# Patient Record
Sex: Female | Born: 1975 | Race: White | Hispanic: No | Marital: Married | Smoking: Former smoker
Health system: Southern US, Community
[De-identification: ages and names within clinical notes are randomized; demographics above are authoritative.]

## PROBLEM LIST (undated history)

## (undated) DIAGNOSIS — R000 Tachycardia, unspecified: Secondary | ICD-10-CM

## (undated) DIAGNOSIS — O24419 Gestational diabetes mellitus in pregnancy, unspecified control: Secondary | ICD-10-CM

## (undated) DIAGNOSIS — Z9889 Other specified postprocedural states: Secondary | ICD-10-CM

## (undated) DIAGNOSIS — IMO0002 Reserved for concepts with insufficient information to code with codable children: Secondary | ICD-10-CM

## (undated) DIAGNOSIS — R112 Nausea with vomiting, unspecified: Secondary | ICD-10-CM

## (undated) HISTORY — PX: LEEP: SHX91

---

## 1998-01-23 ENCOUNTER — Other Ambulatory Visit: Admission: RE | Admit: 1998-01-23 | Discharge: 1998-01-23 | Payer: Self-pay | Admitting: Obstetrics and Gynecology

## 1998-06-15 ENCOUNTER — Inpatient Hospital Stay (HOSPITAL_COMMUNITY): Admission: AD | Admit: 1998-06-15 | Discharge: 1998-06-15 | Payer: Self-pay | Admitting: Obstetrics and Gynecology

## 1998-08-08 ENCOUNTER — Inpatient Hospital Stay (HOSPITAL_COMMUNITY): Admission: AD | Admit: 1998-08-08 | Discharge: 1998-08-10 | Payer: Self-pay | Admitting: *Deleted

## 1998-12-04 ENCOUNTER — Other Ambulatory Visit: Admission: RE | Admit: 1998-12-04 | Discharge: 1998-12-04 | Payer: Self-pay | Admitting: *Deleted

## 1998-12-26 ENCOUNTER — Ambulatory Visit (HOSPITAL_COMMUNITY): Admission: RE | Admit: 1998-12-26 | Discharge: 1998-12-26 | Payer: Self-pay | Admitting: *Deleted

## 2001-01-27 ENCOUNTER — Ambulatory Visit (HOSPITAL_COMMUNITY): Admission: RE | Admit: 2001-01-27 | Discharge: 2001-01-27 | Payer: Self-pay | Admitting: Family Medicine

## 2001-01-27 ENCOUNTER — Encounter: Payer: Self-pay | Admitting: Family Medicine

## 2001-08-03 HISTORY — PX: CHOLECYSTECTOMY: SHX55

## 2002-03-03 ENCOUNTER — Other Ambulatory Visit: Admission: RE | Admit: 2002-03-03 | Discharge: 2002-03-03 | Payer: Self-pay | Admitting: Obstetrics and Gynecology

## 2002-08-03 HISTORY — PX: WRIST SURGERY: SHX841

## 2002-08-24 ENCOUNTER — Ambulatory Visit (HOSPITAL_COMMUNITY): Admission: RE | Admit: 2002-08-24 | Discharge: 2002-08-24 | Payer: Self-pay | Admitting: Family Medicine

## 2002-08-24 ENCOUNTER — Encounter: Payer: Self-pay | Admitting: Family Medicine

## 2002-12-04 ENCOUNTER — Ambulatory Visit (HOSPITAL_COMMUNITY): Admission: RE | Admit: 2002-12-04 | Discharge: 2002-12-04 | Payer: Self-pay | Admitting: *Deleted

## 2002-12-04 ENCOUNTER — Encounter: Payer: Self-pay | Admitting: *Deleted

## 2003-01-31 ENCOUNTER — Encounter (INDEPENDENT_AMBULATORY_CARE_PROVIDER_SITE_OTHER): Payer: Self-pay | Admitting: Specialist

## 2003-01-31 ENCOUNTER — Observation Stay (HOSPITAL_COMMUNITY): Admission: RE | Admit: 2003-01-31 | Discharge: 2003-02-01 | Payer: Self-pay | Admitting: *Deleted

## 2004-09-16 ENCOUNTER — Other Ambulatory Visit: Admission: RE | Admit: 2004-09-16 | Discharge: 2004-09-16 | Payer: Self-pay | Admitting: Obstetrics and Gynecology

## 2004-10-30 ENCOUNTER — Emergency Department (HOSPITAL_COMMUNITY): Admission: EM | Admit: 2004-10-30 | Discharge: 2004-10-31 | Payer: Self-pay | Admitting: Emergency Medicine

## 2007-08-04 DIAGNOSIS — O24419 Gestational diabetes mellitus in pregnancy, unspecified control: Secondary | ICD-10-CM

## 2007-08-04 HISTORY — DX: Gestational diabetes mellitus in pregnancy, unspecified control: O24.419

## 2008-05-06 ENCOUNTER — Inpatient Hospital Stay (HOSPITAL_COMMUNITY): Admission: AD | Admit: 2008-05-06 | Discharge: 2008-05-06 | Payer: Self-pay | Admitting: Obstetrics and Gynecology

## 2008-05-14 ENCOUNTER — Inpatient Hospital Stay (HOSPITAL_COMMUNITY): Admission: AD | Admit: 2008-05-14 | Discharge: 2008-05-16 | Payer: Self-pay | Admitting: Obstetrics and Gynecology

## 2008-05-25 ENCOUNTER — Encounter: Admission: RE | Admit: 2008-05-25 | Discharge: 2008-06-22 | Payer: Self-pay | Admitting: Obstetrics and Gynecology

## 2008-06-27 ENCOUNTER — Encounter: Admission: RE | Admit: 2008-06-27 | Discharge: 2008-06-27 | Payer: Self-pay | Admitting: Obstetrics and Gynecology

## 2009-08-03 DIAGNOSIS — IMO0002 Reserved for concepts with insufficient information to code with codable children: Secondary | ICD-10-CM

## 2009-08-03 DIAGNOSIS — R87619 Unspecified abnormal cytological findings in specimens from cervix uteri: Secondary | ICD-10-CM

## 2009-08-03 HISTORY — DX: Unspecified abnormal cytological findings in specimens from cervix uteri: R87.619

## 2009-08-03 HISTORY — DX: Reserved for concepts with insufficient information to code with codable children: IMO0002

## 2010-08-24 ENCOUNTER — Encounter: Payer: Self-pay | Admitting: Obstetrics and Gynecology

## 2010-09-29 ENCOUNTER — Other Ambulatory Visit: Payer: Self-pay | Admitting: Certified Nurse Midwife

## 2010-10-03 ENCOUNTER — Ambulatory Visit (INDEPENDENT_AMBULATORY_CARE_PROVIDER_SITE_OTHER): Payer: 59

## 2010-10-03 ENCOUNTER — Inpatient Hospital Stay (INDEPENDENT_AMBULATORY_CARE_PROVIDER_SITE_OTHER)
Admission: RE | Admit: 2010-10-03 | Discharge: 2010-10-03 | Disposition: A | Discharge status: Home / Self Care | Payer: 59 | Source: Ambulatory Visit | Attending: Emergency Medicine | Admitting: Emergency Medicine

## 2010-10-03 DIAGNOSIS — K5289 Other specified noninfective gastroenteritis and colitis: Secondary | ICD-10-CM

## 2010-10-03 LAB — COMPREHENSIVE METABOLIC PANEL
AST: 26 U/L (ref 0–37)
Albumin: 4.5 g/dL (ref 3.5–5.2)
Calcium: 9.5 mg/dL (ref 8.4–10.5)
Chloride: 102 mEq/L (ref 96–112)
Creatinine, Ser: 0.82 mg/dL (ref 0.4–1.2)
GFR calc Af Amer: >60 mL/min (ref >60)
Total Bilirubin: 0.6 mg/dL (ref 0.3–1.2)

## 2010-10-03 LAB — CBC
MCH: 29.3 pg (ref 26.0–34.0)
Platelets: 245 10*3/uL (ref 150–400)
RBC: 4.88 MIL/uL (ref 3.87–5.11)

## 2010-10-03 LAB — POCT URINALYSIS DIPSTICK
Glucose, UA: NEGATIVE mg/dL
Nitrite: NEGATIVE
Urobilinogen, UA: 0.2 mg/dL (ref 0.0–1.0)

## 2010-10-03 LAB — DIFFERENTIAL
Basophils Absolute: 0.1 10*3/uL (ref 0.0–0.1)
Basophils Relative: 1 % (ref 0–1)
Eosinophils Absolute: 0.1 10*3/uL (ref 0.0–0.7)
Monocytes Relative: 7 % (ref 3–12)
Neutrophils Relative %: 50 % (ref 43–77)

## 2010-10-03 LAB — POCT PREGNANCY, URINE: Preg Test, Ur: NEGATIVE

## 2010-10-23 ENCOUNTER — Other Ambulatory Visit: Payer: Self-pay | Admitting: Certified Nurse Midwife

## 2010-12-16 NOTE — H&P (Signed)
[name], [name]                ACCOUNT NO.:  [id number]   MEDICAL RECORD NO.:  [id number]          PATIENT TYPE:  INP   LOCATION:  9101                          FACILITY:  [facility]   PHYSICIAN:  Lenoard Aden, M.D.DATE OF BIRTH:  11-27-1975   DATE OF ADMISSION:  05/14/2008  DATE OF DISCHARGE:                              HISTORY & PHYSICAL   CHIEF COMPLAINT:  Labor.   HISTORY OF PRESENT ILLNESS:  She is a 35 year old white female G2, P1 at  60 weeks' gestation with increased frequency of contractions today.   ALLERGIES:  She has no known drug allergies.   MEDICATIONS:  1. Prenatal vitamins.  2. Glyburide.   SOCIAL HISTORY:  She is a nonsmoker, nondrinker, and denies domestic or  physical violence.   FAMILY HISTORY:  Lung and colon cancer and heart disease.   PAST OB/GYN HISTORY:  History of one miscarriage, one abortion, and one  vaginal delivery of a 9-pound 7-ounce female in 2000.   PHYSICAL EXAMINATION:  GENERAL:  She is a well-developed, well-nourished  white female in moderate amount of distress.  HEENT:  Normal.  LUNGS:  Clear.  HEART:  Regular rhythm.  ABDOMEN:  Soft, gravid, and nontender.  Estimated fetal weight 8-1/2 to  9 pounds.  Cervix is 4-5, 80%, vertex, and -2.  EXTREMITIES:  No cords.  NEUROLOGICAL:  Nonfocal.  SKIN:  Intact.   IMPRESSION:  1. Term intrauterine pregnancy in active labor.  2. Class A, type 2 diabetes mellitus, stable on glyburide.   PLAN:  To admit, epidural as needed, and anticipate attempts at vaginal  delivery.      Lenoard Aden, M.D.  Electronically Signed     RJT/MEDQ  D:  05/14/2008  T:  05/15/2008  Job:  [id number]

## 2010-12-16 NOTE — H&P (Signed)
[name], [name]                ACCOUNT NO.:  [id number]   MEDICAL RECORD NO.:  [id number]          PATIENT TYPE:  MAT   LOCATION:  MATC                          FACILITY:  [facility]   PHYSICIAN:  Lenoard Aden, M.D.DATE OF BIRTH:  02-19-1976   DATE OF ADMISSION:  05/06/2008  DATE OF DISCHARGE:  05/06/2008                              HISTORY & PHYSICAL   INTERPRETATION ON NST   DESCRIPTION:  The patient presented for 36 weeks of increased frequency  of contractions and a history of being dilated to 3-4 cm in the office.  She presented to maternity admissions with contractions every 4-6  minutes.  Cervical exam was stable.  NST was reactive with fetal heart  tones in the 140s-150s with accelerations, no decelerations, and  intermittent contractions were noted.  Reactive NST was noted.  The  patient was discharged to home.      Lenoard Aden, M.D.  Electronically Signed     RJT/MEDQ  D:  05/06/2008  T:  05/07/2008  Job:  [id number]

## 2010-12-19 NOTE — Op Note (Signed)
   NAME:  [name], KREITER                          ACCOUNT NO.:  [id number]   MEDICAL RECORD NO.:  [id number]                   PATIENT TYPE:  OBV   LOCATION:  0374                                 FACILITY:  [facility]   PHYSICIAN:  Vikki Ports, M.D.         DATE OF BIRTH:  08-28-1975   DATE OF PROCEDURE:  01/31/2003  DATE OF DISCHARGE:                                 OPERATIVE REPORT   PREOPERATIVE DIAGNOSIS:  Biliary dyskinesia, symptomatic.   POSTOPERATIVE DIAGNOSIS:  Biliary dyskinesia, symptomatic.   OPERATION PERFORMED:  Laparoscopic cholecystectomy.   SURGEON:  Vikki Ports, M.D.   ASSISTANT:  Donnie Coffin. Samuella Cota, M.D.   ANESTHESIA:  General anesthesia endotracheal tube.   DESCRIPTION OF PROCEDURE:  The patient was taken to the operating room and  placed in the supine position.  After adequate anesthesia was induced using  endotracheal tube the abdomen was prepped and draped in usual sterile  fashion.  Using a transverse infraumbilical incision I dissected down to the  fascia.  The fascia was opened vertically.  An 0 Vicryl pursestring suture  was placed around the fascial defect.  Pneumoperitoneum was obtained and  under direct visualization a 10 mm port was placed in the subxiphoid region,  two 5 mm ports were placed in the right abdomen.  The gallbladder was  identified and retracted cephalad.  I identified the neck of the  gallbladder.  The cystic duct was easily dissected free, good window was  created behind it.  It was very small measuring only 1 mm in diameter.  It  was triply clipped and divided.  The cystic artery also was identified just  posterior to the lymph node.  It was triply clipped and divided.  The  gallbladder was taken off the gallbladder bed and removed through the  umbilical port.  Adequate hemostasis was ensured.  Pneumoperitoneum was  released.  The infraumbilical incision fascial defect was closed with the 0  Vicryl pursestring suture.   Skin incisions were closed with subcuticular 4-0  Monocryl.  Steri-Strips and sterile dressings were applied.  The patient  tolerated the procedure well and went to PACU in good condition.                                               Vikki Ports, M.D.    KRH/MEDQ  D:  01/31/2003  T:  01/31/2003  Job:  [id number]

## 2011-01-26 ENCOUNTER — Other Ambulatory Visit: Payer: Self-pay | Admitting: Certified Nurse Midwife

## 2011-01-26 LAB — ANTIBODY SCREEN: Antibody Screen: NEGATIVE

## 2011-01-26 LAB — HIV ANTIBODY (ROUTINE TESTING W REFLEX): HIV: NONREACTIVE

## 2011-01-26 LAB — GC/CHLAMYDIA PROBE AMP, GENITAL: Gonorrhea: NEGATIVE

## 2011-01-26 LAB — ABO/RH

## 2011-05-05 LAB — CBC
HCT: 37.8
Hemoglobin: 13
Hemoglobin: 13.6
MCHC: 33.4
MCHC: 34.5
MCV: 92
MCV: 95.1
RBC: 4.41
RDW: 13.4

## 2011-05-18 ENCOUNTER — Encounter (HOSPITAL_COMMUNITY): Payer: Self-pay | Admitting: *Deleted

## 2011-05-18 ENCOUNTER — Inpatient Hospital Stay (HOSPITAL_COMMUNITY)
Admission: AD | Admit: 2011-05-18 | Discharge: 2011-05-20 | DRG: 778 | Disposition: A | Discharge status: Home / Self Care | Payer: 59 | Source: Ambulatory Visit | Attending: Obstetrics & Gynecology | Admitting: Obstetrics & Gynecology

## 2011-05-18 DIAGNOSIS — O4700 False labor before 37 completed weeks of gestation, unspecified trimester: Principal | ICD-10-CM | POA: Diagnosis present

## 2011-05-18 HISTORY — DX: Other specified postprocedural states: Z98.890

## 2011-05-18 HISTORY — DX: Gestational diabetes mellitus in pregnancy, unspecified control: O24.419

## 2011-05-18 HISTORY — DX: Other specified postprocedural states: R11.2

## 2011-05-18 HISTORY — DX: Reserved for concepts with insufficient information to code with codable children: IMO0002

## 2011-05-18 MED ORDER — CALCIUM CARBONATE ANTACID 500 MG PO CHEW: 2.0000 | CHEWABLE_TABLET | ORAL | Status: DC | PRN

## 2011-05-18 MED ORDER — PRENATAL PLUS 27-1 MG PO TABS
1.0000 | ORAL_TABLET | Freq: Every day | ORAL | Status: DC
  Filled 2011-05-18: qty 1

## 2011-05-18 MED ORDER — ZOLPIDEM TARTRATE 10 MG PO TABS
10.0000 mg | ORAL_TABLET | Freq: Every evening | ORAL | Status: DC | PRN
  Administered 2011-05-18 – 2011-05-19 (×2): 10 mg via ORAL
  Filled 2011-05-18 (×2): qty 1

## 2011-05-18 MED ORDER — NIFEDIPINE 10 MG PO CAPS
20.0000 mg | ORAL_CAPSULE | Freq: Once | ORAL | Status: AC
  Administered 2011-05-18: 20 mg via ORAL
  Filled 2011-05-18: qty 2

## 2011-05-18 MED ORDER — PRENATAL PLUS 27-1 MG PO TABS
1.0000 | ORAL_TABLET | Freq: Every day | ORAL | Status: DC
  Administered 2011-05-18 – 2011-05-19 (×2): 1 via ORAL
  Filled 2011-05-18 (×2): qty 1

## 2011-05-18 MED ORDER — ACETAMINOPHEN 325 MG PO TABS: 650.0000 mg | ORAL_TABLET | ORAL | Status: DC | PRN

## 2011-05-18 MED ORDER — BETAMETHASONE SOD PHOS & ACET 6 (3-3) MG/ML IJ SUSP
12.0000 mg | INTRAMUSCULAR | Status: AC
  Administered 2011-05-18 – 2011-05-19 (×2): 12 mg via INTRAMUSCULAR
  Filled 2011-05-18 (×2): qty 2

## 2011-05-18 MED ORDER — DOCUSATE SODIUM 100 MG PO CAPS
100.0000 mg | ORAL_CAPSULE | Freq: Every day | ORAL | Status: DC
  Administered 2011-05-19 – 2011-05-20 (×2): 100 mg via ORAL
  Filled 2011-05-18 (×3): qty 1

## 2011-05-18 MED ORDER — NIFEDIPINE 10 MG PO CAPS
10.0000 mg | ORAL_CAPSULE | Freq: Four times a day (QID) | ORAL | Status: DC
  Administered 2011-05-19 – 2011-05-20 (×7): 10 mg via ORAL
  Filled 2011-05-18 (×8): qty 1

## 2011-05-18 NOTE — H&P (Signed)
Agree with note and plan, chart reviewed

## 2011-05-18 NOTE — H&P (Signed)
  OB ADMISSION/ HISTORY & PHYSICAL:  Admission Date: 05/18/2011  3:51 PM  Admit Diagnosis: 26 weeks threatened preterm labor  [name] is a 35 y.o. female presenting for threatened PTL  Prenatal History: G5P2022   EDC : 08/24/2011 Prenatal care at [facility] since [redacted] weeks gestation  Prenatal course complicated by preterm cervical change at 23 weeks. NO  Previous history of preterm cervical change.  Initial cervical length 6cm - at 18 4/7 weeks during anatomy sono Repeat sono for PTL symptoms & short cervix on bimanual exam at 23 weeks - shortened to 2.5 with funneling to 0.8 internal os.  Weekly sono and tocolytic (procardia 60mg  daily in divided dose of XL) Stable cervix at 3.0-3.4 with minimal funneling x 2 weeks  Today's sono with increase cervical shortening and significant funneling this week Now at 2.5 length with internal os dilation 2.1 / width 1.8  Prenatal Labs: ABO, Rh:   A+ Antibody:  negative Rubella:   Immune RPR:   NR HBsAg:   negative HIV:   NR GBS:   not done this pregnancy / urine cx negative Quad: negative / NL  OB/GYN History:  1997 - TAB at 6 weeks 2000- SVD at 40 weeks / birth weight 9-7 / monitored for PTL - no cervical change 2000 - LEEP 2009 - SVD at 38 weeks / birth weight 8-0 / GDM-A2 ( no preterm cervical change - sono's reviewed)   Medical / Surgical History :  Past medical history:  Past Medical History  Diagnosis Date  . PONV (postoperative nausea and vomiting)   . Abnormal Pap smear 2011  . Gestational diabetes 2009     Past surgical history:  Past Surgical History  Procedure Date  . Cholecystectomy 1992  . Wrist surgery 1994     Family History:  Family History  Problem Relation Age of Onset  . Arthritis Mother   . Cancer Father   . Hyperlipidemia Father   . Cancer Sister      Social History:  reports that she has quit smoking. She does not have any smokeless tobacco history on file. She reports  that she does not drink alcohol or use illicit drugs.   Allergies: Review of patient's allergies indicates no known allergies.    Current Medications at time of admission:  Prenatal vitamin daily Procardia 30XL BID  Review of Systems: Cramping intermittently x 4 weeks No bleeding No vaginal discharge + FM  Physical Exam:   General: Alert and oriented x 3 Heart:RRR Lungs:Clear Abdomen:gravid / non-tender Extremities:no edema Genitalia / VE: deferred  TOCO:no ctx / no UI   Assessment: 26 weeks Threatened Preterm labor  Preterm cervical change - short cervix with dilation and funneling at internal cervical os HX GDM / monitors FBS 3 times per week pending GTT at 28 weeks Hx LEEP 2000 -2 term deliveries / no preterm cervical change in pregnancy in 2009  Plan:  Admit Monitor toco x 24-48 hours to evaluate uterine activity on procardia BMZ course Fetal fibronectin prior to discharge MFM consult for any further recommendations  Dr Juliene Pina on-call updated with admission & plan of care CNM to follow /round daily - consult /collaborate with any further change in status  [name] 05/18/2011, 6:17 PM

## 2011-05-19 ENCOUNTER — Inpatient Hospital Stay (HOSPITAL_COMMUNITY): Payer: 59

## 2011-05-19 LAB — GLUCOSE, CAPILLARY: Glucose-Capillary: 123 mg/dL — ABNORMAL HIGH (ref 70–99)

## 2011-05-19 LAB — FETAL FIBRONECTIN: Fetal Fibronectin: NEGATIVE

## 2011-05-19 NOTE — Progress Notes (Signed)
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  Pt expected to return @ approx 2100 05/19/11

## 2011-05-19 NOTE — Progress Notes (Signed)
Upon opening [facility] to chart for this patient it was found that patient was not admitted and her FHR/UC tracing was storing under another patient's name Freight forwarder). [name] tracing from [phone] on 05/18/11 is stored under [name] name and MRN # [id number].  I D/C'd A.Brandon from [facility] and admitted current patient [name] at 2039. Management made aware.

## 2011-05-19 NOTE — Progress Notes (Signed)
UR Chart review completed.  

## 2011-05-19 NOTE — Progress Notes (Signed)
  S: Feeling well     Intermittent cramping / same as at home - not graphing on toco     No discharge or LOF     + FM   O:  VS: Blood pressure 104/57, pulse 89, temperature 97.8 F (36.6 C), temperature source Oral, resp. rate 18, height 5' 8.5" (1.74 m), weight 95.709 kg (211 lb), last menstrual period 11/17/2010.        Abdomen soft and non-tender        FHR : baseline 140 / variability moderate / accels +  / decels none        EFM: Reactive NST        Toco: no ctx or UI graphed        Cervix : deferred        Membranes: intact        Extremities - no edema / SCD in place  A: Threatened preterm labor with preterm cervical change at 26 1/7  P:  BMZ course      Continue procardia - tocolytic      Fetal fibronectin today      MFM and NICU consults today      Repeat sono - cervical length tomorrow     Nisaiah Bechtol 05/19/2011, 9:21 AM

## 2011-05-20 ENCOUNTER — Encounter (HOSPITAL_COMMUNITY): Payer: Self-pay | Admitting: Obstetrics and Gynecology

## 2011-05-20 ENCOUNTER — Inpatient Hospital Stay (HOSPITAL_COMMUNITY): Payer: 59

## 2011-05-20 DIAGNOSIS — O4700 False labor before 37 completed weeks of gestation, unspecified trimester: Secondary | ICD-10-CM

## 2011-05-20 LAB — PROGESTERONE: Progesterone: 101.9 ng/mL

## 2011-05-20 LAB — GLUCOSE, CAPILLARY: Glucose-Capillary: 121 mg/dL — ABNORMAL HIGH (ref 70–99)

## 2011-05-20 MED ORDER — PROGESTERONE MICRONIZED 200 MG PO CAPS
200.0000 mg | ORAL_CAPSULE | Freq: Every day | ORAL | Status: AC
  Administered 2011-05-20: 200 mg via VAGINAL
  Filled 2011-05-20: qty 1

## 2011-05-20 MED ORDER — PROGESTERONE 200 MG VA SUPP: 200.0000 mg | Freq: Once | VAGINAL | Status: DC

## 2011-05-20 NOTE — Progress Notes (Signed)
  S: Feeling well     No CTX / no pressure / NO discharge or LOF   O:  VS: Blood pressure 105/67, pulse 83, temperature 98.2 F (36.8 C), temperature source Oral, resp. rate 18, height 5' 8.5" (1.74 m), weight 95.119 kg (209 lb 11.2 oz), last menstrual period 11/17/2010.        FHR : reactive intermittent        EFM: Category 1        Toco: no CTX or UI        Cervix : deferred exam        Membranes: intact  Fetal fibronectin - negative SONO - stable from office sono  / no significant measurement differences  A: Threatened Pre-term labor  P: Awaiting MFM consultation from yesterday     Stable for discharge home     Recommend progesterone level today to check baseline level     Add prometium 200 mg intravaginally to daily prevention strategy  OV Monday at [facility] - weekly cervical length Modified bedrest - OOW     BAILEY,TANYA 05/20/2011, 12:25 PM

## 2011-05-20 NOTE — Consult Note (Signed)
MATERNAL FETAL MEDICINE CONSULT  Patient Name: [name] Medical Record Number:  [id number] Date of Birth: [birth year] Requesting Physician Name:  Robley Fries Date of Service: 05/20/2011  Chief Complaint Cervical shortening/threatened preterm labor  History of Present Illness [name] was seen today for prenatal diagnosis secondary to recent cervical shortening and increased uterine activity, at the request of Dr. Juliene Pina.  The patient is a 35 y.o. [id number], with an EDD of 08/24/2011, by Last Menstrual Period dating method.  She has had about three weeks of intermittent uterine cramping.  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  Her past obstetric history and LEEP management is reviewed and does not appear to be contributory to the present condition.    Review of Systems A comprehensive review of systems was negative except for: Genitourinary: positive for increased uterine cramping  Patient History OB History    Grav Para Term Preterm Abortions TAB SAB Ect Mult Living   5 2 2  0 2 1 1  0 0 2     # Outc Date GA Lbr Len/2nd Wgt Sex Del Anes PTL Lv   1 TAB            2 TRM            3 TRM            4 SAB            5 GRA            Comments: System Generated. Please review and update pregnancy details.      Past Medical History  Diagnosis Date  . PONV (postoperative nausea and vomiting)   . Abnormal Pap smear 2011  . Gestational diabetes 2009    Past Surgical History  Procedure Date  . Cholecystectomy 1992  . Wrist surgery 1994    History   Social History  . Marital Status: Married    Spouse Name: N/A    Number of Children: N/A  . Years of Education: N/A   Social History Main Topics  . Smoking status: Former Games developer  . Smokeless tobacco: Not on file  . Alcohol Use: No  . Drug Use: No  . Sexually Active: Not Currently   Other Topics Concern  . Not on file   Social History Narrative  . No narrative on file     Family History  Problem Relation Age of Onset  . Arthritis Mother   . Cancer Father   . Hyperlipidemia Father   . Cancer Sister    In addition, the patient has no family history of mental retardation, birth defects, or genetic diseases.  Physical Examination Filed Vitals:   05/20/11 1209  BP: 105/67  Pulse: 83  Temp: 98.2 F (36.8 C)  Resp: 18   General appearance - alert, well appearing, and in no distress and oriented to person, place, and time.  See vital signs and ultrasound results; the closed cervix is 1.7 cm long with external  abdominal pressure. Limited directed physical exam;  No fundal tenderness.  Normal fetal heart rate activity and minimal uterine activity.  Assessment and Recommendations 1. Short cervix/threatened preterm labor; No evidence of active preterm labor with fibronectin negative results and closed cervical length >1.5 cm.   A. Stop Procardia;   B. May be managed as outpatient with re-assessment of closed cervical length in next 1-2 weeks.  C. Candidate for magnesium sulfate neuroprophylaxis if  readmitted in preterm labor.   D. Glucose screen in next 1-2 weeks  E. Agree with initiation of vaginal progesterone  F. Continue modification of work; may increase in 2-4 week;s time if no evidence of further changes in cervix   I spent 25 minutes with [name] today of which 50% was face-to-face counseling. Thank you for the opportunity to work with [name]  Alima Naser,JOE

## 2011-05-20 NOTE — Progress Notes (Signed)
UR chart review completed.  

## 2011-05-21 NOTE — Discharge Summary (Signed)
Physician Discharge Summary  Patient ID: [name] MRN: [id number] DOB/AGE: 36/18/1977 36 y.o.  Admit date: 05/18/2011 Discharge date: 05/21/2011  Admission Diagnoses: threatened preterm labor, preterm cervical change  Discharge Diagnoses:  Active Problems:  Threatened premature labor complicating pregnancy, less than 37 weeks, antepartum   Discharged Condition: stable  Hospital Course:  48 hours of continuous toco monitoring - no regular uterine activity / reactive NST Completion of BMZ course NICU and MFM consult completed Tocolytic with procardia  Fetal fibronectin - negative result  Consults: NICU and MFM  Significant Diagnostic Studies: radiology: Ultrasound: cervical length stable from office exam Shortened length - with internal funneling and dilation / cephalic presentation  Treatments: steroids: betamethasone 12.5 mg x 2 (24 hour interval)  Discharge Exam: Blood pressure 105/67, pulse 83, temperature 98.2 F (36.8 C), temperature source Oral, resp. rate 18, height 5' 8.5" (1.74 m), weight 95.119 kg (209 lb 11.2 oz), last menstrual period 11/17/2010, unknown if currently breastfeeding. General appearance: normal / NAD Lungs - clear  Abdomen - soft and non-tender / uterus non-tender  Disposition: Home or Self Care   Discharge Medication List as of 05/20/2011  2:47 PM    CONTINUE these medications which have NOT CHANGED   Details  acetaminophen (TYLENOL) 325 MG tablet Take 650 mg by mouth every 6 (six) hours as needed. Patient takes for pain , Until Discontinued, Historical Med    NIFEdipine (PROCARDIA XL/ADALAT-CC) 30 MG 24 hr tablet Take 30 mg by mouth 2 (two) times daily.  , Until Discontinued, Historical Med    prenatal vitamin w/FE, FA (PRENATAL 1 + 1) 27-1 MG TABS Take 1 tablet by mouth daily.  , Until Discontinued, Historical Med      Activity : modified bedrest at home / OOW Diet: regular  Additional medications - progesterone suppositories  100-200mg  at HS for PTL prevention  Follow-up Information    Follow up with Xinyi Batton in 1 week.   Contact information:   [address] [place] [address] [phone]          Signed: Marlinda Mike 05/21/2011, 10:35 AM

## 2011-07-21 LAB — STREP B DNA PROBE: GBS: NEGATIVE

## 2011-08-04 NOTE — L&D Delivery Note (Signed)
   Delivery Note  Labor course: onset regular ctx at 0900 / 6cm with BBOW at office 1100 / AROM at 1337 with clear fluid / pitocin augment at 1730 due to elevated liver enzymes for active management  Epidural at 1945  Complete dilation at 2028 Onset of pushing at 2030  FHR second stage 135  Delivery of a viable female at 2044 by CNM in LOA position. (birth weight 9lbs - 7oz) Nuchal Cord none. Cord double clamped after cessation of pulsation, cut by FOB.  Cord blood sample collected.   Placenta delivered shultz at 2051 intact with 3 VC.  Placenta to pathology - PTL / GDM-A2 / elevated liver enzymes - suspect cholestasis Uterine tone firm with moderate bleeding initially / massage and IV pitocin with reduction of bleeding & good uterine tone  Perineum and vaginal tissues intact -no laceration identified   Est. Blood Loss (mL): 400 ml  Complications: none  Mom to postpartum.  Baby to nursery-stable.  Yanni Quiroa 08/06/2011, 9:32 PM

## 2011-08-06 ENCOUNTER — Other Ambulatory Visit: Payer: Self-pay | Admitting: Certified Nurse Midwife

## 2011-08-06 ENCOUNTER — Inpatient Hospital Stay (HOSPITAL_COMMUNITY)
Admission: AD | Admit: 2011-08-06 | Discharge: 2011-08-08 | DRG: 775 | Disposition: A | Discharge status: Home / Self Care | Payer: 59 | Source: Ambulatory Visit | Attending: Obstetrics and Gynecology | Admitting: Obstetrics and Gynecology

## 2011-08-06 ENCOUNTER — Inpatient Hospital Stay (HOSPITAL_COMMUNITY): Payer: 59 | Admitting: Anesthesiology

## 2011-08-06 ENCOUNTER — Encounter (HOSPITAL_COMMUNITY): Payer: Self-pay | Admitting: *Deleted

## 2011-08-06 ENCOUNTER — Encounter (HOSPITAL_COMMUNITY): Payer: Self-pay | Admitting: Anesthesiology

## 2011-08-06 DIAGNOSIS — O26879 Cervical shortening, unspecified trimester: Secondary | ICD-10-CM | POA: Diagnosis present

## 2011-08-06 DIAGNOSIS — O99814 Abnormal glucose complicating childbirth: Principal | ICD-10-CM | POA: Diagnosis present

## 2011-08-06 DIAGNOSIS — O3660X0 Maternal care for excessive fetal growth, unspecified trimester, not applicable or unspecified: Secondary | ICD-10-CM | POA: Diagnosis present

## 2011-08-06 DIAGNOSIS — O24419 Gestational diabetes mellitus in pregnancy, unspecified control: Secondary | ICD-10-CM | POA: Diagnosis present

## 2011-08-06 DIAGNOSIS — O99892 Other specified diseases and conditions complicating childbirth: Secondary | ICD-10-CM | POA: Diagnosis present

## 2011-08-06 DIAGNOSIS — O09519 Supervision of elderly primigravida, unspecified trimester: Secondary | ICD-10-CM | POA: Diagnosis present

## 2011-08-06 DIAGNOSIS — R748 Abnormal levels of other serum enzymes: Secondary | ICD-10-CM | POA: Diagnosis present

## 2011-08-06 DIAGNOSIS — O409XX0 Polyhydramnios, unspecified trimester, not applicable or unspecified: Secondary | ICD-10-CM | POA: Diagnosis present

## 2011-08-06 DIAGNOSIS — K838 Other specified diseases of biliary tract: Secondary | ICD-10-CM | POA: Diagnosis present

## 2011-08-06 LAB — COMPREHENSIVE METABOLIC PANEL
ALT: 339 U/L — ABNORMAL HIGH (ref 0–35)
AST: 195 U/L — ABNORMAL HIGH (ref 0–37)
Albumin: 2.4 g/dL — ABNORMAL LOW (ref 3.5–5.2)
Alkaline Phosphatase: 203 U/L — ABNORMAL HIGH (ref 39–117)
BUN: 9 mg/dL (ref 6–23)
CO2: 19 mEq/L (ref 19–32)
Calcium: 8.9 mg/dL (ref 8.4–10.5)
Chloride: 100 mEq/L (ref 96–112)
Creatinine, Ser: 0.61 mg/dL (ref 0.50–1.10)
GFR calc Af Amer: >90 mL/min (ref >90)
GFR calc non Af Amer: >90 mL/min (ref >90)
Glucose, Bld: 155 mg/dL — ABNORMAL HIGH (ref 70–99)
Potassium: 3.8 mEq/L (ref 3.5–5.1)
Sodium: 131 mEq/L — ABNORMAL LOW (ref 135–145)
Total Bilirubin: 1.5 mg/dL — ABNORMAL HIGH (ref 0.3–1.2)
Total Protein: 5.9 g/dL — ABNORMAL LOW (ref 6.0–8.3)

## 2011-08-06 LAB — CBC
HCT: 40.3 % (ref 36.0–46.0)
HCT: 38.5 % (ref 36.0–46.0)
HCT: 36.8 % (ref 36.0–46.0)
Hemoglobin: 13.7 g/dL (ref 12.0–15.0)
Hemoglobin: 13 g/dL (ref 12.0–15.0)
MCH: 30.6 pg (ref 26.0–34.0)
MCH: 30.6 pg (ref 26.0–34.0)
MCH: 31.2 pg (ref 26.0–34.0)
MCHC: 34 g/dL (ref 30.0–36.0)
MCHC: 33.8 g/dL (ref 30.0–36.0)
MCV: 90.2 fL (ref 78.0–100.0)
MCV: 90.6 fL (ref 78.0–100.0)
MCV: 90.4 fL (ref 78.0–100.0)
Platelets: 144 10*3/uL — ABNORMAL LOW (ref 150–400)
Platelets: 126 10*3/uL — ABNORMAL LOW (ref 150–400)
RBC: 4.47 MIL/uL (ref 3.87–5.11)
RBC: 4.25 MIL/uL (ref 3.87–5.11)
RDW: 14 % (ref 11.5–15.5)
RDW: 14 % (ref 11.5–15.5)
RDW: 13.9 % (ref 11.5–15.5)
WBC: 8.7 10*3/uL (ref 4.0–10.5)
WBC: 7.6 10*3/uL (ref 4.0–10.5)
WBC: 14.4 10*3/uL — ABNORMAL HIGH (ref 4.0–10.5)

## 2011-08-06 LAB — D-DIMER, QUANTITATIVE: D-Dimer, Quant: 1.15 ug/mL-FEU — ABNORMAL HIGH (ref 0.00–0.48)

## 2011-08-06 LAB — GLUCOSE, RANDOM: Glucose, Bld: 157 mg/dL — ABNORMAL HIGH (ref 70–99)

## 2011-08-06 LAB — FIBRINOGEN: Fibrinogen: 659 mg/dL — ABNORMAL HIGH (ref 204–475)

## 2011-08-06 LAB — RPR: RPR Ser Ql: NONREACTIVE

## 2011-08-06 LAB — URIC ACID: Uric Acid, Serum: 5.6 mg/dL (ref 2.4–7.0)

## 2011-08-06 MED ORDER — EPHEDRINE 5 MG/ML INJ: 10.0000 mg | INTRAVENOUS | Status: DC | PRN

## 2011-08-06 MED ORDER — ACETAMINOPHEN 325 MG PO TABS: 650.0000 mg | ORAL_TABLET | ORAL | Status: DC | PRN

## 2011-08-06 MED ORDER — EPHEDRINE 5 MG/ML INJ
10.0000 mg | INTRAVENOUS | Status: DC | PRN
  Filled 2011-08-06: qty 4

## 2011-08-06 MED ORDER — LACTATED RINGERS IV SOLN
INTRAVENOUS | Status: DC
  Administered 2011-08-06: 13:00:00 via INTRAVENOUS

## 2011-08-06 MED ORDER — LACTATED RINGERS IV SOLN
500.0000 mL | Freq: Once | INTRAVENOUS | Status: AC
  Administered 2011-08-06: 500 mL via INTRAVENOUS

## 2011-08-06 MED ORDER — DIPHENHYDRAMINE HCL 50 MG/ML IJ SOLN: 12.5000 mg | INTRAMUSCULAR | Status: DC | PRN

## 2011-08-06 MED ORDER — FENTANYL 2.5 MCG/ML BUPIVACAINE 1/10 % EPIDURAL INFUSION (WH - ANES)
INTRAMUSCULAR | Status: DC | PRN
  Administered 2011-08-06: 14 mL/h via EPIDURAL

## 2011-08-06 MED ORDER — OXYTOCIN 20 UNITS IN LACTATED RINGERS INFUSION - SIMPLE: 125.0000 mL/h | Freq: Once | INTRAVENOUS | Status: DC

## 2011-08-06 MED ORDER — CITRIC ACID-SODIUM CITRATE 334-500 MG/5ML PO SOLN: 30.0000 mL | ORAL | Status: DC | PRN

## 2011-08-06 MED ORDER — PHENYLEPHRINE 40 MCG/ML (10ML) SYRINGE FOR IV PUSH (FOR BLOOD PRESSURE SUPPORT): 80.0000 ug | PREFILLED_SYRINGE | INTRAVENOUS | Status: DC | PRN

## 2011-08-06 MED ORDER — SODIUM BICARBONATE 8.4 % IV SOLN
INTRAVENOUS | Status: DC | PRN
  Administered 2011-08-06: 5 mL via EPIDURAL

## 2011-08-06 MED ORDER — LACTATED RINGERS IV SOLN
500.0000 mL | INTRAVENOUS | Status: DC | PRN
  Administered 2011-08-06: 500 mL via INTRAVENOUS

## 2011-08-06 MED ORDER — OXYTOCIN 20 UNITS IN LACTATED RINGERS INFUSION - SIMPLE
1.0000 m[IU]/min | INTRAVENOUS | Status: DC
  Administered 2011-08-06: 2 m[IU]/min via INTRAVENOUS
  Administered 2011-08-06: 333 m[IU]/min via INTRAVENOUS
  Administered 2011-08-06: 4 m[IU]/min via INTRAVENOUS
  Filled 2011-08-06: qty 1000

## 2011-08-06 MED ORDER — OXYTOCIN BOLUS FROM INFUSION
500.0000 mL | Freq: Once | INTRAVENOUS | Status: DC
  Filled 2011-08-06: qty 500

## 2011-08-06 MED ORDER — PHENYLEPHRINE 40 MCG/ML (10ML) SYRINGE FOR IV PUSH (FOR BLOOD PRESSURE SUPPORT)
80.0000 ug | PREFILLED_SYRINGE | INTRAVENOUS | Status: DC | PRN
  Filled 2011-08-06: qty 5

## 2011-08-06 MED ORDER — IBUPROFEN 600 MG PO TABS: 600.0000 mg | ORAL_TABLET | Freq: Four times a day (QID) | ORAL | Status: DC | PRN

## 2011-08-06 MED ORDER — FENTANYL 2.5 MCG/ML BUPIVACAINE 1/10 % EPIDURAL INFUSION (WH - ANES)
14.0000 mL/h | INTRAMUSCULAR | Status: DC
  Filled 2011-08-06: qty 60

## 2011-08-06 NOTE — Progress Notes (Signed)
Patient ID: MAYOLA MCBAIN, female   DOB: Jun 29, 1976, 36 y.o.   MRN: [id number]  S: Feeling well     Ctx not painful - just tight   O:  VS: Blood pressure 137/85, pulse 93, temperature 97.5 F (36.4 C), temperature source Oral, resp. rate 18, height 5\' 9"  (1.753 m), weight 101.152 kg (223 lb), last menstrual period 11/17/2010, unknown if currently breastfeeding.        FHR : baseline 130 / variability moderate / accels + / decels none        Toco: contractions every 4-6 minutes / mild ctx        Cervix : 6 / 100% / vtx 0  / ROT / BBOW        Membranes: AROM - slow leak of AF slowly / large amount of fluid  A: Latent labor     FHR category 1  P: Expectant management      Epidural at pt request for pain     BAILEY,TANYA 08/06/2011, 1:53 PM

## 2011-08-06 NOTE — Progress Notes (Signed)
Patient ID: [name], female   DOB: Sep 20, 1975, 36 y.o.   MRN: [id number]  S: Feeling urge to push with some ctx     Tolerating contractions well     Epidural placed - dosed / anesthesia at bedside   O:  VS: Blood pressure 120/70, pulse 93, temperature 98.3 F (36.8 C), temperature source Axillary, resp. rate 18, height 5\' 9"  (1.753 m), weight 101.152 kg (223 lb), last menstrual period 11/17/2010, SpO2 97.00%, unknown if currently breastfeeding.        FHR : baseline 145 / variability moderate / accels + / decels none        Toco: contractions every 2-3 minutes / moderate ctx / pitocin at 71mu/min        Cervix : deferred - awaiting 2nd dose epidural / repositioning        Membranes: leaking large amt clear fluid  A: active labor     FHR category 1  P: prepare for delivery     [name] 08/06/2011, 9:02 PM

## 2011-08-06 NOTE — Anesthesia Procedure Notes (Signed)

## 2011-08-06 NOTE — Progress Notes (Signed)
Patient ID: [name], female   DOB: 02/23/1976, 36 y.o.   MRN: [id number]  S:  Feeling well - some ctx stronger Tolerating contractions well - not uncomfortable yet  States did not think anything serious about the itching - related to dry skin of pregnancy   Reports more likely symptoms for more than a week / no rash No headache or vision changes/ denies epigastric pain  O:   VS: Blood pressure 117/90, pulse 119, temperature 98.1 F (36.7 C), temperature source Oral, resp. rate 18, height 5\' 9"  (1.753 m), weight 101.152 kg (223 lb), last menstrual period 11/17/2010, unknown if currently breastfeeding.  Labs: Hgb 13/7 / Hct 40.3 / Plt 144           Bile acid = pending           SGOT 195 / SGPT 339           Uric acid 5.6 / creatnine 0.6  FHR : baseline 150 / variability moderate / accels + / decels none Toco: contractions every 2-4 minutes / mild Cervix : deferred exam Membranes: clear AF leaking  A: Prodromal to Latent labor (admitted with cervical progression to advanced dilation at 6cm) FHR category 1 GDM-A2 Elevated Liver enzymes  P:  Discussed with patient likely diagnosis cholestasis of pregnancy with presentation but must consider            differential of atypical pre-eclampsia with HELLP  Recommendation for active management of labor at this point w/ pitocin low dose protocol - pt agrees  Monitor BP / repeat labs in 6 hours  If bile acids elevated - will start Actigall postpartum If bile acids normal - will manage as PEC / HELLP - consider magnesium postpartum  Monitor CBG every 4 hours intrapartum  Additionally will send hepatitis panel   Dr Seymour Bars (on-call) updated with status & plan of care - agrees / will update with status report     [name] 08/06/2011, 5:09 PM

## 2011-08-06 NOTE — Anesthesia Preprocedure Evaluation (Addendum)
Anesthesia Evaluation  Patient identified by MRN, date of birth, ID band Patient awake    Reviewed: Allergy & Precautions, H&P , Patient's Chart, lab work & pertinent test results  History of Anesthesia Complications (+) PONV  Airway Mallampati: III TM Distance: >3 FB Neck ROM: full    Dental No notable dental hx.    Pulmonary neg pulmonary ROS,  clear to auscultation  Pulmonary exam normal       Cardiovascular neg cardio ROS regular Normal    Neuro/Psych Negative Neurological ROS  Negative Psych ROS   GI/Hepatic negative GI ROS, Neg liver ROS, Increased LFTs   Endo/Other  Negative Endocrine ROSDiabetes mellitus-  Renal/GU negative Renal ROS     Musculoskeletal   Abdominal   Peds  Hematology negative hematology ROS (+)   Anesthesia Other Findings Increased LFTs  Reproductive/Obstetrics (+) Pregnancy                           Anesthesia Physical Anesthesia Plan  ASA: III  Anesthesia Plan: Epidural   Post-op Pain Management:    Induction:   Airway Management Planned:   Additional Equipment:   Intra-op Plan:   Post-operative Plan:   Informed Consent: I have reviewed the patients History and Physical, chart, labs and discussed the procedure including the risks, benefits and alternatives for the proposed anesthesia with the patient or authorized representative who has indicated his/her understanding and acceptance.   Dental Advisory Given  Plan Discussed with:   Anesthesia Plan Comments: (Labs checked- platelets confirmed with RN in room.Coag's are okay Fetal heart tracing, per RN, reported to be stable enough for sitting procedure. Discussed epidural, and patient consents to the procedure:  included risk of possible headache,backache, failed block, allergic reaction, and nerve injury. This patient was asked if she had any questions or concerns before the procedure started. )        Anesthesia Quick Evaluation

## 2011-08-06 NOTE — H&P (Signed)
OB ADMISSION/ HISTORY & PHYSICAL:  Admission Date: 08/06/2011 12:23 PM   Admit Diagnosis:  1) 37 3/7 prodromal - latent labor  2) Advanced dilation (6cm) 3) GDM-A2   ZOELLE MARKUS is a 36 y.o. female presenting for advanced dilation to 6cm with BBOW in office today for routine apt. Ctx in office on NST every 2-5 minutes. Patient feels ctx only tight, not painful but constant pressure since yesterday. No LOF. Small brown discharge today.  Prenatal History: [id number]   EDC : 08/24/2011, by Last Menstrual Period  Prenatal care at [facility]  Primary Ob provider - Marlinda Mike CNM Prenatal course complicated by AMA, GDM-A2, preterm labor with progressive cervical change, borderline low platelets 142 in November, LGA (EFW at 9-7 today), polyhydramnios.  Prenatal course summary: Normal cervical length at 18 weeks CUS / sono visit - HX LEEP but normal term SVD last pregnancy.  HX GDM-A2 / large AC at CUS sono - decision to begin carb modified diet and CBG monitoring in lieu of GTT screen.   Patient was seen for a problem visit at 22 weeks with shortened cervix and funneling. Placed on modified bedrest, Ibuprofen x 72 hours, procardia.  Admission for progressive cervical change at 26 weeks - received BMZ course / magnesium sulfate / restarted on procardia / progesterone supplement added. CBG stable s/p BMZ.   Platelet 142 with third trimester labs. ( first trimester baseline 210)  Cervical dilation to 2cm by 30 weeks  - remained stable on procardia and progesterone without progressive change until 34 weeks. Fetal growth LGA with development of polyhydramnios.  At 34 weeks -  Cervical dilation progressed to 4cm /  initiated on glyberide at 34 weeks with persistent elevated FBS over 100.   Today - cervical exam remained stable at 4cm until today's visit. EFW 9-7 with poly. BPP 8-8.  Prenatal Labs: ABO, Rh: A (06/25 0000) Positive Antibody: Negative (06/25 0000) Rubella:  Immune (06/25 0000)  RPR: Nonreactive (11/11 0000)  HBsAg: Negative (06/25 0000)  HIV: Non-reactive (06/25 0000)  GBS: Negative (12/18 0000)  1 hr Glucola : abnormal   Medical / Surgical History :  Past medical history:  Past Medical History  Diagnosis Date  . PONV (postoperative nausea and vomiting)   . Abnormal Pap smear 2011  . Gestational diabetes 2009     Past surgical history:  Past Surgical History  Procedure Date  . Cholecystectomy 1992  . Wrist surgery 1994   LEEP - 2000  Family History:  Family History  Problem Relation Age of Onset  . Arthritis Mother   . Cancer Father   . Hyperlipidemia Father   . Cancer Sister      Social History:  reports that she has quit smoking. She does not have any smokeless tobacco history on file. She reports that she does not drink alcohol or use illicit drugs.   Allergies: Review of patient's allergies indicates no known allergies.    Current Medications at time of admission:  Prenatal vitamin daily glyberide 2.5 mg daily  Review of Systems: Active FM No LOF - some small brown discharge this am in bathroom Ctx persistent but not painful  Increase pelvic pressure since yesterday Reports intense itching for few day to a week -no rash / forgot to mention at visits - figures it is dry skin  Physical Exam: VS: pulse -96 / resp 18 / BP 118/86  Cervical exam: Dilation: 6 Effacement (%): 100 Station: 0 Exam by:: T.  Fredric Mare, CNM  General: pleasant / NAD Heart: RR Lungs: Unlabored / clear Abdomen: gravid / nontender Extremities: no edema  FHR: reactive NST at office - baseline 150 TOCO: mild every 2-5 minutes  Assessment: 37 + weeks with early labor onset - progressive cervical change to 6cm with BBOW Negative GBS GDM-A2 - stable BS LGA - EFW today on sono 9-7 (previous SVD of 9-7 without complications) Polyhydramnios  New report of persistent itching without rash  Plan:  Check baseline labs - CMP / bile acids  / uric acid / CBG AROM - will leak fluid down slowly with poly Expectant management Anticipate active labor in next 2-4 hours Planning epidural for pain management  Natthew Marlatt 08/06/2011, 5:25 PM

## 2011-08-07 ENCOUNTER — Encounter (HOSPITAL_COMMUNITY): Payer: Self-pay | Admitting: *Deleted

## 2011-08-07 LAB — HEPATITIS PANEL, ACUTE
HCV Ab: NEGATIVE
Hep A IgM: NEGATIVE
Hep B C IgM: NEGATIVE
Hepatitis B Surface Ag: NEGATIVE

## 2011-08-07 LAB — COMPREHENSIVE METABOLIC PANEL
ALT: 308 U/L — ABNORMAL HIGH (ref 0–35)
AST: 168 U/L — ABNORMAL HIGH (ref 0–37)
Albumin: 2.2 g/dL — ABNORMAL LOW (ref 3.5–5.2)
Alkaline Phosphatase: 168 U/L — ABNORMAL HIGH (ref 39–117)
BUN: 6 mg/dL (ref 6–23)
CO2: 25 mEq/L (ref 19–32)
Calcium: 8.8 mg/dL (ref 8.4–10.5)
Chloride: 101 mEq/L (ref 96–112)
Creatinine, Ser: 0.58 mg/dL (ref 0.50–1.10)
GFR calc Af Amer: >90 mL/min (ref >90)
GFR calc non Af Amer: >90 mL/min (ref >90)
Glucose, Bld: 101 mg/dL — ABNORMAL HIGH (ref 70–99)
Potassium: 3.6 mEq/L (ref 3.5–5.1)
Sodium: 132 mEq/L — ABNORMAL LOW (ref 135–145)
Total Bilirubin: 1.4 mg/dL — ABNORMAL HIGH (ref 0.3–1.2)
Total Protein: 5.6 g/dL — ABNORMAL LOW (ref 6.0–8.3)

## 2011-08-07 LAB — CBC
HCT: 36.5 % (ref 36.0–46.0)
Hemoglobin: 12.6 g/dL (ref 12.0–15.0)
MCH: 31 pg (ref 26.0–34.0)
MCHC: 34.5 g/dL (ref 30.0–36.0)
MCV: 89.7 fL (ref 78.0–100.0)
Platelets: 128 10*3/uL — ABNORMAL LOW (ref 150–400)
RBC: 4.07 MIL/uL (ref 3.87–5.11)
RDW: 13.9 % (ref 11.5–15.5)
WBC: 12.6 10*3/uL — ABNORMAL HIGH (ref 4.0–10.5)

## 2011-08-07 LAB — GLUCOSE, CAPILLARY
Glucose-Capillary: 117 mg/dL — ABNORMAL HIGH (ref 70–99)
Glucose-Capillary: 124 mg/dL — ABNORMAL HIGH (ref 70–99)

## 2011-08-07 MED ORDER — DIPHENHYDRAMINE HCL 25 MG PO CAPS: 25.0000 mg | ORAL_CAPSULE | Freq: Four times a day (QID) | ORAL | Status: DC | PRN

## 2011-08-07 MED ORDER — OXYCODONE-ACETAMINOPHEN 5-325 MG PO TABS
1.0000 | ORAL_TABLET | ORAL | Status: DC | PRN
Start: 2011-08-07 — End: 2011-08-08

## 2011-08-07 MED ORDER — URSODIOL 300 MG PO CAPS
300.0000 mg | ORAL_CAPSULE | Freq: Three times a day (TID) | ORAL | Status: DC
  Administered 2011-08-07 – 2011-08-08 (×4): 300 mg via ORAL
  Filled 2011-08-07 (×4): qty 1

## 2011-08-07 MED ORDER — PRENATAL PLUS 27-1 MG PO TABS
1.0000 | ORAL_TABLET | Freq: Every day | ORAL | Status: DC
  Administered 2011-08-07 – 2011-08-08 (×2): 1 via ORAL
  Filled 2011-08-07 (×2): qty 1

## 2011-08-07 MED ORDER — LANOLIN HYDROUS EX OINT: TOPICAL_OINTMENT | CUTANEOUS | Status: DC | PRN

## 2011-08-07 MED ORDER — IBUPROFEN 600 MG PO TABS
600.0000 mg | ORAL_TABLET | Freq: Four times a day (QID) | ORAL | Status: DC
  Administered 2011-08-07 – 2011-08-08 (×6): 600 mg via ORAL
  Filled 2011-08-07 (×6): qty 1

## 2011-08-07 MED ORDER — MISOPROSTOL 200 MCG PO TABS
800.0000 ug | ORAL_TABLET | ORAL | Status: DC | PRN
  Filled 2011-08-07: qty 4

## 2011-08-07 MED ORDER — BENZOCAINE-MENTHOL 20-0.5 % EX AERO: 1.0000 "application " | INHALATION_SPRAY | CUTANEOUS | Status: DC | PRN

## 2011-08-07 MED ORDER — SIMETHICONE 80 MG PO CHEW: 80.0000 mg | CHEWABLE_TABLET | ORAL | Status: DC | PRN

## 2011-08-07 NOTE — Anesthesia Postprocedure Evaluation (Signed)
  Anesthesia Post-op Note  Patient: [name]  Procedure(s) Performed: * No procedures listed *  Patient Location: PACU and Mother/Baby  Anesthesia Type: Epidural  Level of Consciousness: awake, alert  and oriented  Airway and Oxygen Therapy: Patient Spontanous Breathing  Post-op Pain: none  Post-op Assessment: Post-op Vital signs reviewed  Post-op Vital Signs: Reviewed and stable  Complications: No apparent anesthesia complications

## 2011-08-07 NOTE — Progress Notes (Signed)
Patient ID: [name], female   DOB: November 17, 1975, 36 y.o.   MRN: [id number]  PPD 1 SVD  S:  Reports feeling well             Tolerating po/ No nausea or vomiting / no epigastric pain             Bleeding is light             Pain controlled withprescription NSAID's including motrin             Up ad lib / ambulatory / voiding QS              No PIH symptoms / + generalized itching but better today   Newborn breast feeding  / Circumcision planned today   O:  A & O x 3 NAD              VS: Blood pressure 103/67, pulse 77, temperature 97.7 F (36.5 C), temperature source Oral, resp. rate 16, height 5\' 9"  (1.753 m), weight 101.152 kg (223 lb), last menstrual period 11/17/2010, SpO2 98.00%, unknown if currently breastfeeding.   LABS: WBC/Hgb/Hct/Plts:  12.6/12.6/36.5/128 (01/04 0520)                          SGOT 168 / SGPT 308                         FBS 117                         Bile acids - pending                                call to [employer] to check status of result this am                                  may be 24-72hours before resulted due to send-out to reference lab - [facility]  I&O:   + 4800  (4900 r/t IVF yesterday)  Lungs: Clear and unlabored  Heart: regular rate and rhythm  Abdomen: soft, non-tender, non-distended              Fundus: firm, non-tender, Ueven  Perineum: intact  Lochia: light  Extremities: no edema, no calf pain or tenderness    A: PPD # 1 SVD  - stable status             GDM-A2-delivered - elevated FBS today             Elevated liver enzymes with etiology cholestasis vs PEC- HELLP                     Normal BP / PLT stable / uric acid normal - no evidence of HELLP                     + generalized itching - most likely liver etiology - cholestasis                      Pending bile acids this am - awaiting call from [employer] w result   P:  Routine post partum orders  Consult with Dr Seymour Bars - agrees start Actigall w/ pending  bile acids                Low fat diet x 2 weeks - until LE normalized             Monitor FBS                       may need agent in immediate PP / GTT  - will decide in am with FBS result                      hgba1c at 6-12 weeks PP             Repeat Liver enzymes and bile acids in 1 week at [facility]            GI consult if not resolving in 2 weeks  [name] 08/07/2011, 7:25 AM

## 2011-08-08 LAB — BILE ACIDS, TOTAL: Bile Acids Total: 77 umol/L — ABNORMAL HIGH (ref 0–19)

## 2011-08-08 LAB — GLUCOSE, CAPILLARY: Glucose-Capillary: 96 mg/dL (ref 70–99)

## 2011-08-08 MED ORDER — BENZOCAINE-MENTHOL 20-0.5 % EX AERO
INHALATION_SPRAY | CUTANEOUS | Status: AC
  Administered 2011-08-08: 12:00:00
  Filled 2011-08-08: qty 56

## 2011-08-08 MED ORDER — URSODIOL 300 MG PO CAPS: 300.0000 mg | ORAL_CAPSULE | Freq: Three times a day (TID) | ORAL | Status: AC

## 2011-08-08 MED ORDER — IBUPROFEN 800 MG PO TABS: 800.0000 mg | ORAL_TABLET | Freq: Three times a day (TID) | ORAL | Status: AC

## 2011-08-08 NOTE — Progress Notes (Signed)
Post Partum Day #2            Information for the patient's newborn:  Jocilynn, Grade [[id number]]  female   / circumcision done Feeding: breast, good latch, cluster feeding  Subjective: No HA, SOB, CP, F/C, breast symptoms. Pain controlled with motrin. Normal vaginal bleeding, small clots x 2 yesterday, none today.    Reports itching improved, denies PEC S/S.   Objective:  Temp:  [97.7 F (36.5 C)-98 F (36.7 C)] 97.7 F (36.5 C) (01/05 0547) Pulse Rate:  [81-96] 83  (01/05 0547) Resp:  [18] 18  (01/05 0547) BP: (101-110)/(66-74) 101/66 mmHg (01/05 0547)  No intake or output data in the 24 hours ending 08/08/11 1018     Basename 08/07/11 0520 08/06/11 2233  WBC 12.6* 14.4*  HGB 12.6 12.7  HCT 36.5 36.8  PLT 128* 129*   CBG (last 3)   Basename 08/08/11 0808 08/07/11 0650 08/07/11 0649  GLUCAP 96 117* 124*     Blood type: A/Positive/-- (06/25 0000) Rubella: Immune (06/25 0000)    Physical Exam:  General: alert, cooperative and no distress Uterine Fundus: firm U+1 Lochia: appropriate Perineum: intact, edema none DVT Evaluation: No cords or calf tenderness. No significant calf/ankle edema.    Assessment/Plan: PPD # 2 / 36 y.o., [id number] S/P:spontaneous vaginal   Active Problems:  GDM, class A2- delivered  Stable FBS, will continue to monitor at home, no meds at this time   Elevated liver enzymes - etiology ICP vs PEC/HELLP  -bile acids pending (results likely after the weekend)  -continue Actigall for now as clinically improved symptoms  -repeat LFT's at [facility] in 1 week   Postpartum care following vaginal delivery (1/3)   normal postpartum exam  Continue current postpartum care  D/C home   LOS: 2 days   PAUL,DANIELA, CNM, MSN 08/08/2011, 10:18 AM

## 2011-08-08 NOTE — Discharge Summary (Signed)
Obstetric Discharge Summary Reason for Admission: onset of labor, GDM A2 Prenatal Procedures: NST, ultrasound and tocolysis for PTC Intrapartum Procedures: spontaneous vaginal delivery Postpartum Procedures: none Complications-Operative and Postpartum: none Hemoglobin  Date Value Range Status  08/07/2011 12.6  12.0-15.0 (g/dL) Final     HCT  Date Value Range Status  08/07/2011 36.5  36.0-46.0 (%) Final    Discharge Diagnoses: Term Pregnancy-delivered, elevated liver enzymes, suspect cholestasis of pregnancy  Discharge Information: Date: 08/08/2011 Activity: pelvic rest Diet: routine and low fat Medications: PNV, Ibuprofen and Actigall Condition: stable Instructions: refer to practice specific booklet Discharge to: home Follow-up Information    Follow up with [name],[name] in 1 week.   Contact information:   [address] [place] [address] [phone]          Newborn Data: Live born female "[name]" Birth Weight: 9 lb 7.2 oz (4286 g) APGAR: 8, 9  Home with mother.  [name],[name] 08/08/2011, 10:30 AM

## 2011-08-11 ENCOUNTER — Ambulatory Visit (HOSPITAL_COMMUNITY)
Admission: RE | Admit: 2011-08-11 | Discharge: 2011-08-11 | Disposition: A | Discharge status: Home / Self Care | Payer: 59 | Source: Ambulatory Visit | Attending: Obstetrics and Gynecology | Admitting: Obstetrics and Gynecology

## 2011-08-11 ENCOUNTER — Encounter (HOSPITAL_COMMUNITY): Payer: Self-pay | Admitting: *Deleted

## 2011-08-11 NOTE — Progress Notes (Signed)
Adult Lactation Consultation Outpatient Visit Note  Patient Name: [name] Date of Birth: [birth year] Gestational Age at Delivery: Unknown Type of Delivery: Vaginal  Breastfeeding History: Frequency of Breastfeeding: every 1-2 hrs constantly for a total of 9 feedings in 20 hrs Length of Feeding: 30-65 minutes Voids: 6 Stools: 2  Supplementing / Method: Pumping:  Type of Pump:  Purchased Medela Metro double electric breast pump at end of consultation   Frequency:  Volume:    Comments: Mom has not been supplementing and was in for a visit due to consistent weight loss.  Infant was born on [birth year] with a birth weight of 9 lbs, 7 oz.  Discharge weight 8 lbs, 13 oz (7%).  Mom reports the infant's weight yesterday at the doctor's office was 8 lbs, 6 oz.  Today's consult pre-feeding weight was 8 lbs, 2.4 oz.  Infant's weight today was >10% weight loss. Infant is jaundiced with levels of 16.7 today as reported by mom.   Consultation Evaluation:  Initial Feeding Assessment: Pre-feed Weight:3698 g (8 lbs, 2.4 oz) Post-feed Weight:3707 g (8 lbs, 2.7 oz) Amount Transferred: 6 ml Comments:  Mom independently latched infant in a deep latch and check to assure flanging of lips.  Few swallows were heard in the beginning.  Infant never achieved a consistent good sucking rhythm.  Infant was very sleepy and mom had difficulty keeping him awake.  Infant fed for 20 minutes on first breast (left).  Additional Feeding Assessment: Pre-feed Weight:3700 (infant voided on scales and lost 4 oz of weight in the void) Post-feed Weight:3700 Amount Transferred:0 Comments: Infant latched with a good deep latch, with only 1-2 swallows heard in the beginning.  Infant remained sleepy and never developed a consistent pattern on second breast (right).  Fed for 20 minutes.  Additional Feeding Assessment: 55 ml of Enfamil Premium Newborn 20 calories put in SNS and latched infant on left breast.   Amount  Transferred: 28 ml in 20 minutes Comments:  Infant was slow at eating, but did develop a consistent sucking swallowing pattern.  Infant fed for 20 minutes and went to sleep consuming 28 ml.  Suggested mom either finger feed with SNS remaining 27 ml or use bottle with slow flow.  Mom chose to use bottle with green slow flow nipple and infant consumed the remaining 27 ml in 5 minutes with a consistent sucking pattern.    Total Breast milk Transferred this Visit: 6 ml Total Supplement Given: 55 ml 20 cal Enfamil formula  Additional Interventions:  Mom reports feeling breast changes beginning to occur, but upon palpation breast are soft.  Mom is 5 days post-birth and reports a history of Gestational Diabetes.  Discussed the increased risk of delayed lactogenesis with GDM.  Mom purchased pump and written plan of care (POC) given  POC for Home 1.  Set up SNS with 50-60 ml of formula or expressed breast milk and increase amounts based on infant cues. 2.  Latch infant skin-to-skin 3.  If unable to latch with SNS, then finger feed or use slow flow bottle nipple 4.  Pump after breastfeeding for 15-20 minutes or until flow stops with good massaging during pumping.  At end of pumping cut pump off, massage for 5 minutes, and then pump for an additional 5 minutes 5.  Mom may take oral supplements of Fenugreek or Moringa and eat oatmeal.  (Fenugreek and Moringa handouts given as a reference) 6.  Get rest and enjoy infant   Follow-Up  Thursday, January 10th with Peds for bili check Monday, January 14th 10:30 with Lactation    Lendon Ka 08/11/2011, 6:19 PM

## 2011-08-17 ENCOUNTER — Encounter (HOSPITAL_COMMUNITY): Payer: 59

## 2011-08-21 ENCOUNTER — Ambulatory Visit (HOSPITAL_COMMUNITY): Payer: 59

## 2011-08-25 ENCOUNTER — Ambulatory Visit (HOSPITAL_COMMUNITY)
Admission: RE | Admit: 2011-08-25 | Discharge: 2011-08-25 | Disposition: A | Discharge status: Home / Self Care | Payer: 59 | Source: Ambulatory Visit | Attending: Obstetrics and Gynecology | Admitting: Obstetrics and Gynecology

## 2011-08-25 NOTE — Progress Notes (Signed)
Adult Lactation Consultation Outpatient Visit Note  Patient Name: [name](MOTHER)      BABY:  [name]                                                                            DOB:  [birth year] Date of Birth: [birth year]                                      BIRTH WEIGHT: 9-7 Gestational Age at Delivery: 37 weeks                WEIGHT TODAY:  9-13 Type of Delivery: NSVD  Breastfeeding History: Frequency of Breastfeeding: every 3 hours Length of Feeding: 20 min Voids: qs Stools: qs  Supplementing / Method: BOTTLE/FORMULA/EBM 2 oz-4 oz every 2-3 hours pc Pumping:  Type of Pump:pump in style   Frequency:every 3 hours after feedings  Volume:  15 mls total  Comments:    Consultation Evaluation:  Mother here for follow up appointment 2 weeks ago.  She reports pumping 8 times in 24 hours and taking reglan x 2 weeks without improvement in milk supply.  Patient has no risk factors that would contribute to low supply.  Baby latched deeply and nursed fairly well with a lot of stimulation needed.  Few swallows heard and only 6 mls milk transfer after 30 min.  Discussed patients goals and desires at this point and she agrees to start fenugreek and moringa, continue breastfeeding on demand and pump pc 6 times in 24 hours for 1-2 more weeks before making a decision on discontinuing breastfeeding.  A lot of praise given to mother for her efforts.  Reinforced that any breast milk baby receives is beneficial.  Initial Feeding Assessment: Pre-feed [id number] Post-feed [id number]:1308 Amount Transferred:6 mls Comments:total transfer after 15 minutes each breast  Additional Feeding Assessment: Pre-feed Weight: Post-feed Weight: Amount Transferred: Comments:  Additional Feeding Assessment: Pre-feed Weight: Post-feed Weight: Amount Transferred: Comments:  Total Breast milk Transferred this Visit: 6 mls Total Supplement Given: 60 mls similac advance per bottle  Additional  Interventions:   Follow-Up  Will call prn      Hansel Feinstein 08/25/2011, 3:49 PM

## 2013-09-19 ENCOUNTER — Other Ambulatory Visit: Payer: Self-pay | Admitting: Obstetrics and Gynecology

## 2013-09-19 DIAGNOSIS — Z1231 Encounter for screening mammogram for malignant neoplasm of breast: Secondary | ICD-10-CM

## 2013-09-19 DIAGNOSIS — Z803 Family history of malignant neoplasm of breast: Secondary | ICD-10-CM

## 2013-10-05 ENCOUNTER — Ambulatory Visit
Admission: RE | Admit: 2013-10-05 | Discharge: 2013-10-05 | Disposition: A | Discharge status: Home / Self Care | Payer: BC Managed Care – PPO | Source: Ambulatory Visit | Attending: Obstetrics and Gynecology | Admitting: Obstetrics and Gynecology

## 2013-10-05 DIAGNOSIS — Z1231 Encounter for screening mammogram for malignant neoplasm of breast: Secondary | ICD-10-CM

## 2013-10-05 DIAGNOSIS — Z803 Family history of malignant neoplasm of breast: Secondary | ICD-10-CM

## 2013-10-09 ENCOUNTER — Other Ambulatory Visit: Payer: Self-pay | Admitting: Obstetrics and Gynecology

## 2013-10-09 DIAGNOSIS — N63 Unspecified lump in unspecified breast: Secondary | ICD-10-CM

## 2013-10-24 ENCOUNTER — Ambulatory Visit
Admission: RE | Admit: 2013-10-24 | Discharge: 2013-10-24 | Disposition: A | Discharge status: Home / Self Care | Payer: BC Managed Care – PPO | Source: Ambulatory Visit | Attending: Obstetrics and Gynecology | Admitting: Obstetrics and Gynecology

## 2013-10-24 ENCOUNTER — Other Ambulatory Visit: Payer: Self-pay | Admitting: Obstetrics and Gynecology

## 2013-10-24 DIAGNOSIS — N63 Unspecified lump in unspecified breast: Secondary | ICD-10-CM

## 2014-06-04 ENCOUNTER — Encounter (HOSPITAL_COMMUNITY): Payer: Self-pay | Admitting: *Deleted

## 2014-06-20 ENCOUNTER — Other Ambulatory Visit: Payer: Self-pay | Admitting: Certified Nurse Midwife

## 2014-06-20 ENCOUNTER — Other Ambulatory Visit: Payer: Self-pay | Admitting: Obstetrics and Gynecology

## 2014-06-20 DIAGNOSIS — R922 Inconclusive mammogram: Secondary | ICD-10-CM

## 2014-06-20 DIAGNOSIS — R208 Other disturbances of skin sensation: Secondary | ICD-10-CM

## 2014-07-03 ENCOUNTER — Other Ambulatory Visit: Payer: BC Managed Care – PPO

## 2014-07-16 ENCOUNTER — Other Ambulatory Visit: Payer: Self-pay | Admitting: Obstetrics and Gynecology

## 2014-07-16 ENCOUNTER — Ambulatory Visit
Admission: RE | Admit: 2014-07-16 | Discharge: 2014-07-16 | Disposition: A | Discharge status: Home / Self Care | Payer: BC Managed Care – PPO | Source: Ambulatory Visit | Attending: Obstetrics and Gynecology | Admitting: Obstetrics and Gynecology

## 2014-07-16 ENCOUNTER — Encounter (INDEPENDENT_AMBULATORY_CARE_PROVIDER_SITE_OTHER): Payer: Self-pay

## 2014-07-16 DIAGNOSIS — R208 Other disturbances of skin sensation: Secondary | ICD-10-CM

## 2014-07-16 DIAGNOSIS — R922 Inconclusive mammogram: Secondary | ICD-10-CM

## 2015-01-09 ENCOUNTER — Other Ambulatory Visit: Payer: Self-pay | Admitting: Certified Nurse Midwife

## 2015-01-09 DIAGNOSIS — N6489 Other specified disorders of breast: Secondary | ICD-10-CM

## 2015-01-25 ENCOUNTER — Ambulatory Visit
Admission: RE | Admit: 2015-01-25 | Discharge: 2015-01-25 | Disposition: A | Discharge status: Home / Self Care | Payer: BC Managed Care – PPO | Source: Ambulatory Visit | Attending: Certified Nurse Midwife | Admitting: Certified Nurse Midwife

## 2015-01-25 ENCOUNTER — Other Ambulatory Visit: Payer: Self-pay | Admitting: Certified Nurse Midwife

## 2015-01-25 DIAGNOSIS — N6489 Other specified disorders of breast: Secondary | ICD-10-CM

## 2016-11-17 ENCOUNTER — Encounter (HOSPITAL_BASED_OUTPATIENT_CLINIC_OR_DEPARTMENT_OTHER): Payer: Self-pay

## 2016-11-17 ENCOUNTER — Emergency Department (HOSPITAL_BASED_OUTPATIENT_CLINIC_OR_DEPARTMENT_OTHER): Payer: 59

## 2016-11-17 ENCOUNTER — Emergency Department (HOSPITAL_BASED_OUTPATIENT_CLINIC_OR_DEPARTMENT_OTHER)
Admission: EM | Admit: 2016-11-17 | Discharge: 2016-11-17 | Disposition: A | Discharge status: Home / Self Care | Payer: 59 | Attending: Emergency Medicine | Admitting: Emergency Medicine

## 2016-11-17 DIAGNOSIS — R0602 Shortness of breath: Secondary | ICD-10-CM

## 2016-11-17 DIAGNOSIS — R0789 Other chest pain: Secondary | ICD-10-CM | POA: Diagnosis not present

## 2016-11-17 DIAGNOSIS — Z87891 Personal history of nicotine dependence: Secondary | ICD-10-CM | POA: Diagnosis not present

## 2016-11-17 DIAGNOSIS — R079 Chest pain, unspecified: Secondary | ICD-10-CM

## 2016-11-17 LAB — BASIC METABOLIC PANEL
Anion gap: 9 (ref 5–15)
BUN: 22 mg/dL — ABNORMAL HIGH (ref 6–20)
CO2: 27 mmol/L (ref 22–32)
CREATININE: 1.04 mg/dL — AB (ref 0.44–1.00)
Calcium: 9.5 mg/dL (ref 8.9–10.3)
Chloride: 101 mmol/L (ref 101–111)
GFR calc Af Amer: >60 mL/min (ref >60)
GFR calc non Af Amer: >60 mL/min (ref >60)
GLUCOSE: 98 mg/dL (ref 65–99)
Potassium: 3.7 mmol/L (ref 3.5–5.1)
Sodium: 137 mmol/L (ref 135–145)

## 2016-11-17 LAB — CBC
HCT: 43.1 % (ref 36.0–46.0)
Hemoglobin: 14.6 g/dL (ref 12.0–15.0)
MCH: 29.8 pg (ref 26.0–34.0)
MCHC: 33.9 g/dL (ref 30.0–36.0)
MCV: 88 fL (ref 78.0–100.0)
Platelets: 250 10*3/uL (ref 150–400)
RBC: 4.9 MIL/uL (ref 3.87–5.11)
RDW: 12.5 % (ref 11.5–15.5)
WBC: 7.7 10*3/uL (ref 4.0–10.5)

## 2016-11-17 LAB — D-DIMER, QUANTITATIVE: D-Dimer, Quant: 0.29 ug/mL-FEU (ref 0.00–0.50)

## 2016-11-17 LAB — TROPONIN I: Troponin I: <0.03 ng/mL (ref <0.03)

## 2016-11-17 NOTE — ED Provider Notes (Signed)
[facility] Provider Note   CSN: [id number] Arrival date & time: 11/17/16  1322     History   Chief Complaint Chief Complaint  Patient presents with  . Shortness of Breath    HPI [name] is a 41 y.o. female.  HPI  41 y.o. female, presents to the Emergency Department today complaining of shortness of breath x 3-4 days as well as chest tightness this AM. Notes no chest pain. Seen by PCP yesterday and diagnosed with anxiety and told to take benzo. Pt noted minimal relief. Pt states that the shortness of breath persists throughout the day. No N/V. No diaphoresis. No radiation. No URI symptoms. No cough. No hemoptysis. No recent surgeries. No recent travel. No hx DVT/PE. No hx ACS. No FH. No meds PTA. No other symptoms noted.   Past Medical History:  Diagnosis Date  . Abnormal Pap smear 2011  . Gestational diabetes 2009  . PONV (postoperative nausea and vomiting)     Patient Active Problem List   Diagnosis Date Noted  . GDM, class A2- delivered 08/06/2011  . Elevated liver enzymes 08/06/2011  . Postpartum care following vaginal delivery (1/3) 08/06/2011    Past Surgical History:  Procedure Laterality Date  . CHOLECYSTECTOMY  1992  . WRIST SURGERY  1994    OB History    Gravida Para Term Preterm AB Living   0 2 3   SAB TAB Ectopic Multiple Live Births   1 1 0 0 1       Home Medications    Prior to Admission medications   Medication Sig Start Date End Date Taking? Authorizing Provider  cholecalciferol (VITAMIN D) 1000 units tablet Take 1,000 Units by mouth daily.   Yes Historical Provider, MD  Escitalopram Oxalate (LEXAPRO PO) Take by mouth.   Yes Historical Provider, MD    Family History Family History  Problem Relation Age of Onset  . Arthritis Mother   . Cancer Father   . Hyperlipidemia Father   . Cancer Sister     Social History Social History  Substance Use Topics  . Smoking status: Former Games developer  . Smokeless tobacco:  Never Used  . Alcohol use Yes     Comment: occ     Allergies   Patient has no known allergies.   Review of Systems Review of Systems ROS reviewed and all are negative for acute change except as noted in the HPI.  Physical Exam Updated Vital Signs BP 116/80 (BP Location: Left Arm)   Pulse 94   Temp 98.1 F (36.7 C) (Oral)   Resp 18   Ht  (1.727 m)   Wt 102.5 kg   LMP 11/09/2016   SpO2 98%   BMI 34.36 kg/m   Physical Exam  Constitutional: She is oriented to person, place, and time. She appears well-developed and well-nourished. No distress.  HENT:  Head: Normocephalic and atraumatic.  Right Ear: Tympanic membrane, external ear and ear canal normal.  Left Ear: Tympanic membrane, external ear and ear canal normal.  Nose: Nose normal.  Mouth/Throat: Uvula is midline, oropharynx is clear and moist and mucous membranes are normal. No trismus in the jaw. No oropharyngeal exudate, posterior oropharyngeal erythema or tonsillar abscesses.  Eyes: EOM are normal. Pupils are equal, round, and reactive to light.  Neck: Normal range of motion. Neck supple. No tracheal deviation present.  Cardiovascular: Normal rate, regular rhythm, S1 normal, S2 normal, normal heart sounds, intact distal pulses and  normal pulses.   Pulmonary/Chest: Effort normal and breath sounds normal. No respiratory distress. She has no decreased breath sounds. She has no wheezes. She has no rhonchi. She has no rales.  Abdominal: Normal appearance and bowel sounds are normal. There is no tenderness.  Musculoskeletal: Normal range of motion.  Neurological: She is alert and oriented to person, place, and time.  Skin: Skin is warm and dry.  Psychiatric: She has a normal mood and affect. Her speech is normal and behavior is normal. Thought content normal.   ED Treatments / Results  Labs (all labs ordered are listed, but only abnormal results are displayed) Labs Reviewed  BASIC METABOLIC PANEL - Abnormal; Notable  for the following:       Result Value   BUN 22 (*)    Creatinine, Ser 1.04 (*)    All other components within normal limits  CBC  TROPONIN I  D-DIMER, QUANTITATIVE (NOT AT [facility])    EKG  EKG Interpretation  Date/Time:  Tuesday November 17 2016 13:31:46 EDT Ventricular Rate:  87 PR Interval:  124 QRS Duration: 82 QT Interval:  372 QTC Calculation: 447 R Axis:   81 Text Interpretation:  Normal sinus rhythm Normal ECG No significant change since last tracing Confirmed by Bebe Shaggy  MD, DONALD ([id number]) on 11/17/2016 1:39:18 PM       Radiology Dg Chest 2 View  Result Date: 11/17/2016 CLINICAL DATA:  3-4 days of shortness of breath, abnormal EKG, chest tightness this morning. EXAM: CHEST  2 VIEW COMPARISON:  Chest x-ray of October 30, 2004 FINDINGS: The lungs are well-expanded and clear. The heart and pulmonary vascularity are normal. The mediastinum is normal in width. There is no pleural effusion. The bony thorax exhibits no acute abnormality. IMPRESSION: There is no active cardiopulmonary disease. Electronically Signed   By: David  [place] M.D.   On: 11/17/2016 14:24    Procedures Procedures (including critical care time)  Medications Ordered in ED Medications - No data to display   Initial Impression / Assessment and Plan / ED Course  I have reviewed the triage vital signs and the nursing notes.  Pertinent labs & imaging results that were available during my care of the patient were reviewed by me and considered in my medical decision making (see chart for details).  Final Clinical Impressions(s) / ED Diagnoses  {I have reviewed and evaluated the relevant laboratory values. {I have reviewed and evaluated the relevant imaging studies. {I have interpreted the relevant EKG. {I have reviewed the relevant previous healthcare records.  {I obtained HPI from historian.   ED Course:  Assessment: Pt is a 40 y.o. female presents with chest tightness today as well as shortness of breath x 3-4  days. Risk Factors for ACS: none. Patient is to be discharged with recommendation to follow up with PCP in regards to today's hospital visit. Chest pain is not likely of cardiac or pulmonary etiology d/t presentation, perc negative, VSS, no tracheal deviation, no JVD or new murmur, RRR, breath sounds equal bilaterally, EKG without acute abnormalities, negative troponin, and negative CXR. D Dimer negative. Heart Score 0. Pt has been advised start a NSAIDs and return to the ED is CP becomes exertional, associated with diaphoresis or nausea, radiates to left jaw/arm, worsens or becomes concerning in any way. Pt appears reliable for follow up and is agreeable to discharge. Patient is in no acute distress. Vital Signs are stable. Patient is able to ambulate. Patient able to tolerate PO.   Disposition/Plan:  DC Home Additional Verbal discharge instructions given and discussed with patient.  Pt Instructed to f/u with PCP in the next week for evaluation and treatment of symptoms. Return precautions given Pt acknowledges and agrees with plan  Supervising Physician Zadie Rhine, MD  Final diagnoses:  Chest pain, unspecified type  Shortness of breath    New Prescriptions New Prescriptions   No medications on file     Audry Pili, PA-C 11/17/16 1450    Zadie Rhine, MD 11/17/16 1537

## 2016-11-17 NOTE — Discharge Instructions (Signed)
Please read and follow all provided instructions.  Your diagnoses today include:  1. Chest pain, unspecified type   2. Shortness of breath     Tests performed today include: An EKG of your heart A chest x-ray Cardiac enzymes - a blood test for heart muscle damage Blood counts and electrolytes Vital signs. See below for your results today.   Medications prescribed:   Take any prescribed medications only as directed.  Follow-up instructions: Please follow-up with your primary care provider as soon as you can for further evaluation of your symptoms.   Return instructions:  SEEK IMMEDIATE MEDICAL ATTENTION IF: You have severe chest pain, especially if the pain is crushing or pressure-like and spreads to the arms, back, neck, or jaw, or if you have sweating, nausea (feeling sick to your stomach), or shortness of breath. THIS IS AN EMERGENCY. Don't wait to see if the pain will go away. Get medical help at once. Call 911 or 0 (operator). DO NOT drive yourself to the hospital.  Your chest pain gets worse and does not go away with rest.  You have an attack of chest pain lasting longer than usual, despite rest and treatment with the medications your caregiver has prescribed.  You wake from sleep with chest pain or shortness of breath. You feel dizzy or faint. You have chest pain not typical of your usual pain for which you originally saw your caregiver.  You have any other emergent concerns regarding your health.  Additional Information: Chest pain comes from many different causes. Your caregiver has diagnosed you as having chest pain that is not specific for one problem, but does not require admission.  You are at low risk for an acute heart condition or other serious illness.   Your vital signs today were: BP 116/80 (BP Location: Left Arm)    Pulse 94    Temp 98.1 F (36.7 C) (Oral)    Resp 18    Ht  (1.727 m)    Wt 102.5 kg    LMP 11/09/2016    SpO2 98%    BMI 34.36 kg/m  If your  blood pressure (BP) was elevated above 135/85 this visit, please have this repeated by your doctor within one month. --------------

## 2016-11-17 NOTE — ED Triage Notes (Signed)
c/o SOB x 3-4 days-states she was seen by PCP yesterday-EKG done "inverted T waves", CBC and TSH WNL-dx with anxirty-pt c/o chest tightness x this am-NAD-steady gait

## 2017-01-06 ENCOUNTER — Other Ambulatory Visit: Payer: Self-pay | Admitting: Physician Assistant

## 2017-01-06 DIAGNOSIS — M5416 Radiculopathy, lumbar region: Secondary | ICD-10-CM

## 2017-01-19 ENCOUNTER — Ambulatory Visit
Admission: RE | Admit: 2017-01-19 | Discharge: 2017-01-19 | Disposition: A | Discharge status: Home / Self Care | Payer: 59 | Source: Ambulatory Visit | Attending: Physician Assistant | Admitting: Physician Assistant

## 2017-01-19 DIAGNOSIS — M5416 Radiculopathy, lumbar region: Secondary | ICD-10-CM

## 2017-02-16 ENCOUNTER — Other Ambulatory Visit: Payer: Self-pay | Admitting: Neurosurgery

## 2017-03-01 NOTE — H&P (Signed)
Patient ID:   [id number] Patient: Erin RabonMegan Weber  Date of Birth: 1976-03-31 Visit Type: Office Visit   Date: 01/26/2017 04:00 PM Provider: Danae OrleansJoseph D. Venetia MaxonStern MD   This 41 year old female presents for back pain.   History of Present Illness: 1.  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 She reports the pain is currently 2/10 and it is 10/10 at it's worst. Pt notes some LLE numbness with walking; numbness/tingling left foot.  She recalls pain onset with deadlift while participating in Crossfit exercises in November. Symptoms have persisted.   chiropractic caused increased pain accupuncture offered no relief home exercise offered no relief  Toradol injection & muscle relaxer offered no relief  Advil 6/day offers only "some" relief   Hx: healthy SxHx wrist 2003, gallbladder 2004  MRI on [place]           MEDICATIONS(added, continued or stopped this visit):   ALLERGIES:   Review of Systems System Neg/Pos Details  Constitutional Negative Chills, Fatigue, Fever, Malaise, Night sweats, Weight gain and Weight loss.  ENMT Negative Ear drainage, Hearing loss, Nasal drainage, Otalgia, Sinus pressure and Sore throat.  Eyes Negative Eye discharge, Eye pain and Vision changes.  Respiratory Negative Chronic cough, Cough, Dyspnea, Known TB exposure and Wheezing.  Cardio Negative Chest pain, Claudication, Edema and Irregular heartbeat/palpitations.  GI Negative Abdominal pain, Blood in stool, Change in stool pattern, Constipation, Decreased appetite, Diarrhea, Heartburn, Nausea and Vomiting.  GU Negative Dysuria, Hematuria, Polyuria (Genitourinary), Urinary frequency, Urinary incontinence and Urinary retention.  Endocrine Negative Cold intolerance, Heat intolerance, Polydipsia and Polyphagia.  Neuro Positive Numbness in extremity.  Psych Negative Anxiety, Depression and Insomnia.  Integumentary  Negative Brittle hair, Brittle nails, Change in shape/size of mole(s), Hair loss, Hirsutism, Hives, Pruritus, Rash and Skin lesion.  MS Positive Back pain.  Hema/Lymph Negative Easy bleeding, Easy bruising and Lymphadenopathy.  Allergic/Immuno Negative Contact allergy, Environmental allergies, Food allergies and Seasonal allergies.  Reproductive Negative Breast discharge, Breast lumps, Dysmenorrhea, Dyspareunia, History of abnormal PAP smear, Hot flashes, Irregular menses and Vaginal discharge.   Vitals Date Temp F BP Pulse Ht In Wt Lb BMI BSA Pain Score  01/26/2017  124/84 85 69 233.6 34.5  5/10     PHYSICAL EXAM General Level of Distress: no acute distress Overall Appearance: normal  Head and Face  Right Left  Fundoscopic Exam:  normal normal    Cardiovascular Cardiac: regular rate and rhythm without murmur  Right Left  Carotid Pulses: normal normal  Respiratory Lungs: clear to auscultation  Neurological Orientation: normal Recent and Remote Memory: normal Attention Span and Concentration:   normal Language: normal Fund of Knowledge: normal  Right Left Sensation: normal normal Upper Extremity Coordination: normal normal  Lower Extremity Coordination: normal normal  Musculoskeletal Gait and Station: normal  Right Left Upper Extremity Muscle Strength: normal normal Lower Extremity Muscle Strength: normal normal Upper Extremity Muscle Tone:  normal normal Lower Extremity Muscle Tone: normal normal   Motor Strength Upper and lower extremity motor strength was tested in the clinically pertinent muscles. Any abnormal findings will be noted below.   Right Left EHL:  4/5   Deep Tendon Reflexes  Right Left Biceps: normal normal Triceps: normal normal Brachioradialis: normal normal Patellar: normal normal Achilles: normal normal  Cranial Nerves II. Optic Nerve/Visual Fields: normal III. Oculomotor: normal IV. Trochlear: normal V. Trigeminal: normal VI.  Abducens: normal VII. Facial:  normal VIII. Acoustic/Vestibular: normal IX. Glossopharyngeal: normal X. Vagus: normal XI. Spinal Accessory: normal XII. Hypoglossal: normal  Motor and other Tests Lhermittes: negative Rhomberg: negative Pronator drift: absent     Right Left Hoffman's: normal normal Clonus: normal normal Babinski: normal normal SLR: negative positive   Additional Findings:   4 EHL on the left. Full strength in bilateral legs. Full strength in upper extremities. Able to extend within 4 inches of the floor. Negative SLR on the right. Positive SLR 10 degrees on the left. 4 left hip abductor. Reflexes are symmetric.    IMPRESSION Patient presents with left buttock pain (2/10 currently, 10/10 at it's worst) occasionally radiating to the left leg with associated left foot numbness. MRI shows severe L4-5 disc rupture, as well as slight disc bulging at L3-4 and L5-S1. 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 On confrontational testing, the patient has decreased EHL strength and hip abductor strength on the left, and positive straight leg raise on the left. Patient will allow time for her back to heal. She was advised to only participate in light activity.   Physical: 4 EHL on the left. Full strength in bilateral legs. Full strength in upper extremities. Able to extend within 4 inches of the floor. Negative SLR on the right. Positive SLR 10 degrees on the left. 4 left hip abductor. Reflexes are symmetric.     Pain Management Plan Pain Scale: 5/10. Method: Numeric Pain Intensity Scale. Location: back. Onset: 06/20/2017. Duration: varies. Quality: discomforting. Pain management follow-up plan of care: Patient is taking OTC pain relievers for relief..  Fall Risk Plan The patient has not fallen in the last year.  Patient will allow time for her back to heal and will follow-up in 1 month for further evaluation. Patient advised to cease Crossfit exercise and to participate  in only light activity.              Provider:  Venetia MaxonStern MD, Danae OrleansJoseph D 01/26/2017 4:51 PM  Dictation edited by: Lajoyce LauberLisa Hoyt    CC Providers: Maeola HarmanJoseph Americus Scheurich MD  [address] [place], [place] [id number]              Electronically signed by Danae OrleansJoseph D. Venetia MaxonStern MD on 01/26/2017 05:13 PM

## 2017-03-01 NOTE — Pre-Procedure Instructions (Signed)
[name]  03/01/2017      [facility] [place], [place] - [address] [place] [address] Phone: [phone] Fax: [phone]  [id number] [place] [place], [place] - [address] [place] [address] Phone: [phone] Fax: [phone]    Your procedure is scheduled on August 7  [facility] Ad[facility] at 0740 A.M.  Call this number if you have problems the morning of surgery:  [phone]   Remember:  Do not eat food or drink liquids after midnight.  Continue all other medications as directed by your physician except follow these instructions about you medications    Take these medicines the morning of surgery with A SIP OF WATER escitalopram (LEXAPRO)   7 days prior to surgery STOP taking any Aspirin, Aleve, Naproxen, Ibuprofen, Motrin, Advil, Goody's, BC's, all herbal medications, fish oil, and all vitamins    Do not wear jewelry, make-up or nail polish.  Do not wear lotions, powders, or perfumes, or deoderant.  Do not shave 48 hours prior to surgery.    Do not bring valuables to the hospital.  [facility] is not responsible for any belongings or valuables.  Contacts, dentures or bridgework may not be worn into surgery.  Leave your suitcase in the car.  After surgery it may be brought to your room.  For patients admitted to the hospital, discharge time will be determined by your treatment team.  Patients discharged the day of surgery will not be allowed to drive home.    Special instructions:   [place]- Preparing For Surgery  Before surgery, you can play an important role. Because skin is not sterile, your skin needs to be as free of germs as possible. You can reduce the number of germs on your skin by washing with CHG (chlorahexidine gluconate) Soap before surgery.  CHG is an antiseptic cleaner which kills germs and bonds with the skin to continue  killing germs even after washing.  Please do not use if you have an allergy to CHG or antibacterial soaps. If your skin becomes reddened/irritated stop using the CHG.  Do not shave (including legs and underarms) for at least 48 hours prior to first CHG shower. It is OK to shave your face.  Please follow these instructions carefully.   1. Shower the NIGHT BEFORE SURGERY and the MORNING OF SURGERY with CHG.   2. If you chose to wash your hair, wash your hair first as usual with your normal shampoo.  3. After you shampoo, rinse your hair and body thoroughly to remove the shampoo.  4. Use CHG as you would any other liquid soap. You can apply CHG directly to the skin and wash gently with a scrungie or a clean washcloth.   5. Apply the CHG Soap to your body ONLY FROM THE NECK DOWN.  Do not use on open wounds or open sores. Avoid contact with your eyes, ears, mouth and genitals (private parts). Wash genitals (private parts) with your normal soap.  6. Wash thoroughly, paying special attention to the area where your surgery will be performed.  7. Thoroughly rinse your body with warm water from the neck down.  8. DO NOT shower/wash with your normal soap after using and rinsing off the CHG Soap.  9. Pat yourself dry with a CLEAN TOWEL.   10. Wear CLEAN PAJAMAS   11. Place CLEAN SHEETS on your bed the night  of your first shower and DO NOT SLEEP WITH PETS.    Day of Surgery: Do not apply any deodorants/lotions. Please wear clean clothes to the hospital/surgery center.      Please read over the following fact sheets that you were given.

## 2017-03-02 ENCOUNTER — Encounter (HOSPITAL_COMMUNITY)
Admission: RE | Admit: 2017-03-02 | Discharge: 2017-03-02 | Disposition: A | Discharge status: Home / Self Care | Payer: 59 | Source: Ambulatory Visit | Attending: Neurosurgery | Admitting: Neurosurgery

## 2017-03-02 ENCOUNTER — Encounter (HOSPITAL_COMMUNITY): Payer: Self-pay

## 2017-03-02 DIAGNOSIS — Z01812 Encounter for preprocedural laboratory examination: Secondary | ICD-10-CM | POA: Diagnosis present

## 2017-03-02 HISTORY — DX: Tachycardia, unspecified: R00.0

## 2017-03-02 LAB — CBC
HEMATOCRIT: 43.4 % (ref 36.0–46.0)
HEMOGLOBIN: 14.5 g/dL (ref 12.0–15.0)
MCH: 29.1 pg (ref 26.0–34.0)
MCHC: 33.4 g/dL (ref 30.0–36.0)
MCV: 87 fL (ref 78.0–100.0)
Platelets: 242 10*3/uL (ref 150–400)
RBC: 4.99 MIL/uL (ref 3.87–5.11)
RDW: 12.8 % (ref 11.5–15.5)
WBC: 8 10*3/uL (ref 4.0–10.5)

## 2017-03-02 LAB — BASIC METABOLIC PANEL
Anion gap: 9 (ref 5–15)
BUN: 14 mg/dL (ref 6–20)
CALCIUM: 9.4 mg/dL (ref 8.9–10.3)
CO2: 26 mmol/L (ref 22–32)
Chloride: 103 mmol/L (ref 101–111)
Creatinine, Ser: 0.94 mg/dL (ref 0.44–1.00)
GFR calc Af Amer: >60 mL/min (ref >60)
GLUCOSE: 99 mg/dL (ref 65–99)
Potassium: 4 mmol/L (ref 3.5–5.1)
Sodium: 138 mmol/L (ref 135–145)

## 2017-03-02 LAB — HCG, SERUM, QUALITATIVE: PREG SERUM: NEGATIVE

## 2017-03-02 LAB — SURGICAL PCR SCREEN
MRSA, PCR: NEGATIVE
STAPHYLOCOCCUS AUREUS: NEGATIVE

## 2017-03-02 NOTE — Progress Notes (Signed)
PCP - Laurann Montanaynthia White Cardiologist - saw one while pregnant 10 years ago cant remember who   Chest x-ray - 11/17/16 EKG - 11/17/16 Stress Test - denies ECHO - about 10 years ago when she saw cardiologist Cardiac Cath - denies     Patient denies shortness of breath, fever, cough and chest pain at PAT appointment   Patient verbalized understanding of instructions that were given to them at the PAT appointment. Patient was also instructed that they will need to review over the PAT instructions again at home before surgery.

## 2017-03-08 MED ORDER — CEFAZOLIN SODIUM-DEXTROSE 2-4 GM/100ML-% IV SOLN
2.0000 g | INTRAVENOUS | Status: AC
  Administered 2017-03-09: 2 g via INTRAVENOUS
  Filled 2017-03-08: qty 100

## 2017-03-09 ENCOUNTER — Encounter (HOSPITAL_COMMUNITY)
Admission: RE | Disposition: A | Discharge status: Home / Self Care | Payer: Self-pay | Source: Ambulatory Visit | Attending: Neurosurgery

## 2017-03-09 ENCOUNTER — Ambulatory Visit (HOSPITAL_COMMUNITY): Payer: 59

## 2017-03-09 ENCOUNTER — Ambulatory Visit (HOSPITAL_COMMUNITY): Payer: 59 | Admitting: Anesthesiology

## 2017-03-09 ENCOUNTER — Encounter (HOSPITAL_COMMUNITY): Payer: Self-pay | Admitting: Urology

## 2017-03-09 ENCOUNTER — Ambulatory Visit (HOSPITAL_COMMUNITY)
Admission: RE | Admit: 2017-03-09 | Discharge: 2017-03-09 | Disposition: A | Discharge status: Home / Self Care | Payer: 59 | Source: Ambulatory Visit | Attending: Neurosurgery | Admitting: Neurosurgery

## 2017-03-09 DIAGNOSIS — M48061 Spinal stenosis, lumbar region without neurogenic claudication: Secondary | ICD-10-CM | POA: Diagnosis not present

## 2017-03-09 DIAGNOSIS — M5126 Other intervertebral disc displacement, lumbar region: Secondary | ICD-10-CM | POA: Diagnosis not present

## 2017-03-09 DIAGNOSIS — Z419 Encounter for procedure for purposes other than remedying health state, unspecified: Secondary | ICD-10-CM

## 2017-03-09 DIAGNOSIS — Z87891 Personal history of nicotine dependence: Secondary | ICD-10-CM | POA: Diagnosis not present

## 2017-03-09 DIAGNOSIS — M5116 Intervertebral disc disorders with radiculopathy, lumbar region: Secondary | ICD-10-CM | POA: Diagnosis present

## 2017-03-09 HISTORY — PX: LUMBAR LAMINECTOMY/DECOMPRESSION MICRODISCECTOMY: SHX5026

## 2017-03-09 SURGERY — LUMBAR LAMINECTOMY/DECOMPRESSION MICRODISCECTOMY 1 LEVEL
Anesthesia: General | Site: Back | Laterality: Left

## 2017-03-09 MED ORDER — VITAMIN D 1000 UNITS PO TABS: 1000.0000 [IU] | ORAL_TABLET | Freq: Every day | ORAL | Status: DC

## 2017-03-09 MED ORDER — LIDOCAINE-EPINEPHRINE 1 %-1:100000 IJ SOLN
INTRAMUSCULAR | Status: AC
  Filled 2017-03-09: qty 1

## 2017-03-09 MED ORDER — PHENYLEPHRINE HCL 10 MG/ML IJ SOLN
INTRAMUSCULAR | Status: DC | PRN
  Administered 2017-03-09 (×2): 40 ug via INTRAVENOUS

## 2017-03-09 MED ORDER — SENNOSIDES-DOCUSATE SODIUM 8.6-50 MG PO TABS: 1.0000 | ORAL_TABLET | Freq: Every evening | ORAL | Status: DC | PRN

## 2017-03-09 MED ORDER — ACETAMINOPHEN 650 MG RE SUPP: 650.0000 mg | RECTAL | Status: DC | PRN

## 2017-03-09 MED ORDER — LIDOCAINE 2% (20 MG/ML) 5 ML SYRINGE
INTRAMUSCULAR | Status: AC
  Filled 2017-03-09: qty 5

## 2017-03-09 MED ORDER — MORPHINE SULFATE (PF) 4 MG/ML IV SOLN: 2.0000 mg | INTRAVENOUS | Status: DC | PRN

## 2017-03-09 MED ORDER — HEMOSTATIC AGENTS (NO CHARGE) OPTIME
TOPICAL | Status: DC | PRN
  Administered 2017-03-09: 1 via TOPICAL

## 2017-03-09 MED ORDER — IBUPROFEN 200 MG PO TABS: 800.0000 mg | ORAL_TABLET | Freq: Three times a day (TID) | ORAL | Status: DC | PRN

## 2017-03-09 MED ORDER — SODIUM CHLORIDE 0.9% FLUSH: 3.0000 mL | INTRAVENOUS | Status: DC | PRN

## 2017-03-09 MED ORDER — ROCURONIUM BROMIDE 10 MG/ML (PF) SYRINGE
PREFILLED_SYRINGE | INTRAVENOUS | Status: AC
  Filled 2017-03-09: qty 5

## 2017-03-09 MED ORDER — BUPIVACAINE HCL (PF) 0.5 % IJ SOLN
INTRAMUSCULAR | Status: AC
  Filled 2017-03-09: qty 30

## 2017-03-09 MED ORDER — ARTIFICIAL TEARS OPHTHALMIC OINT
TOPICAL_OINTMENT | OPHTHALMIC | Status: AC
  Filled 2017-03-09: qty 3.5

## 2017-03-09 MED ORDER — CEFAZOLIN SODIUM-DEXTROSE 2-4 GM/100ML-% IV SOLN: 2.0000 g | Freq: Three times a day (TID) | INTRAVENOUS | Status: DC

## 2017-03-09 MED ORDER — METHYLPREDNISOLONE ACETATE 80 MG/ML IJ SUSP
INTRAMUSCULAR | Status: AC
  Filled 2017-03-09: qty 1

## 2017-03-09 MED ORDER — ESCITALOPRAM OXALATE 20 MG PO TABS: 20.0000 mg | ORAL_TABLET | Freq: Every day | ORAL | Status: DC

## 2017-03-09 MED ORDER — OXYCODONE HCL 5 MG PO TABS: 5.0000 mg | ORAL_TABLET | ORAL | Status: DC | PRN

## 2017-03-09 MED ORDER — KETOROLAC TROMETHAMINE 15 MG/ML IJ SOLN
15.0000 mg | Freq: Four times a day (QID) | INTRAMUSCULAR | Status: DC | PRN
  Administered 2017-03-09: 15 mg via INTRAVENOUS
  Filled 2017-03-09: qty 1

## 2017-03-09 MED ORDER — THROMBIN 5000 UNITS EX SOLR
CUTANEOUS | Status: DC | PRN
  Administered 2017-03-09 (×2): 5000 [IU] via TOPICAL

## 2017-03-09 MED ORDER — CHLORHEXIDINE GLUCONATE CLOTH 2 % EX PADS: 6.0000 | MEDICATED_PAD | Freq: Once | CUTANEOUS | Status: DC

## 2017-03-09 MED ORDER — SUGAMMADEX SODIUM 200 MG/2ML IV SOLN
INTRAVENOUS | Status: AC
  Filled 2017-03-09: qty 2

## 2017-03-09 MED ORDER — ALUM & MAG HYDROXIDE-SIMETH 200-200-20 MG/5ML PO SUSP: 30.0000 mL | Freq: Four times a day (QID) | ORAL | Status: DC | PRN

## 2017-03-09 MED ORDER — PHENYLEPHRINE 40 MCG/ML (10ML) SYRINGE FOR IV PUSH (FOR BLOOD PRESSURE SUPPORT)
PREFILLED_SYRINGE | INTRAVENOUS | Status: AC
  Filled 2017-03-09: qty 10

## 2017-03-09 MED ORDER — ONDANSETRON HCL 4 MG/2ML IJ SOLN
INTRAMUSCULAR | Status: AC
  Filled 2017-03-09: qty 2

## 2017-03-09 MED ORDER — LACTATED RINGERS IV SOLN
INTRAVENOUS | Status: DC
Start: 2017-03-09 — End: 2017-03-09
  Administered 2017-03-09: 08:00:00 via INTRAVENOUS

## 2017-03-09 MED ORDER — FLEET ENEMA 7-19 GM/118ML RE ENEM: 1.0000 | ENEMA | Freq: Once | RECTAL | Status: DC | PRN

## 2017-03-09 MED ORDER — ACETAMINOPHEN 325 MG PO TABS: 650.0000 mg | ORAL_TABLET | ORAL | Status: DC | PRN

## 2017-03-09 MED ORDER — MIDAZOLAM HCL 2 MG/2ML IJ SOLN
INTRAMUSCULAR | Status: AC
  Filled 2017-03-09: qty 2

## 2017-03-09 MED ORDER — DEXAMETHASONE SODIUM PHOSPHATE 10 MG/ML IJ SOLN
INTRAMUSCULAR | Status: DC | PRN
  Administered 2017-03-09: 10 mg via INTRAVENOUS

## 2017-03-09 MED ORDER — THROMBIN 5000 UNITS EX SOLR
CUTANEOUS | Status: AC
  Filled 2017-03-09: qty 15000

## 2017-03-09 MED ORDER — MENTHOL 3 MG MT LOZG: 1.0000 | LOZENGE | OROMUCOSAL | Status: DC | PRN

## 2017-03-09 MED ORDER — LIDOCAINE HCL (CARDIAC) 20 MG/ML IV SOLN
INTRAVENOUS | Status: DC | PRN
  Administered 2017-03-09: 80 mg via INTRAVENOUS

## 2017-03-09 MED ORDER — HYDROCODONE-ACETAMINOPHEN 5-325 MG PO TABS
1.0000 | ORAL_TABLET | ORAL | Status: DC | PRN
Start: 2017-03-09 — End: 2017-03-09
  Administered 2017-03-09: 2 via ORAL
  Filled 2017-03-09: qty 2

## 2017-03-09 MED ORDER — DEXAMETHASONE SODIUM PHOSPHATE 10 MG/ML IJ SOLN
INTRAMUSCULAR | Status: AC
  Filled 2017-03-09: qty 1

## 2017-03-09 MED ORDER — ROCURONIUM BROMIDE 100 MG/10ML IV SOLN
INTRAVENOUS | Status: DC | PRN
  Administered 2017-03-09: 50 mg via INTRAVENOUS
  Administered 2017-03-09: 10 mg via INTRAVENOUS
  Administered 2017-03-09: 5 mg via INTRAVENOUS

## 2017-03-09 MED ORDER — HYDROMORPHONE HCL 1 MG/ML IJ SOLN: 0.2500 mg | INTRAMUSCULAR | Status: DC | PRN

## 2017-03-09 MED ORDER — DOCUSATE SODIUM 100 MG PO CAPS: 100.0000 mg | ORAL_CAPSULE | Freq: Two times a day (BID) | ORAL | Status: DC

## 2017-03-09 MED ORDER — ONDANSETRON HCL 4 MG/2ML IJ SOLN: 4.0000 mg | Freq: Four times a day (QID) | INTRAMUSCULAR | Status: DC | PRN

## 2017-03-09 MED ORDER — SUGAMMADEX SODIUM 200 MG/2ML IV SOLN
INTRAVENOUS | Status: DC | PRN
  Administered 2017-03-09: 220 mg via INTRAVENOUS

## 2017-03-09 MED ORDER — DEXTROSE 5 % IV SOLN
500.0000 mg | Freq: Four times a day (QID) | INTRAVENOUS | Status: DC | PRN
  Filled 2017-03-09: qty 5

## 2017-03-09 MED ORDER — KCL IN DEXTROSE-NACL 20-5-0.45 MEQ/L-%-% IV SOLN: INTRAVENOUS | Status: DC

## 2017-03-09 MED ORDER — ARTIFICIAL TEARS OPHTHALMIC OINT
TOPICAL_OINTMENT | OPHTHALMIC | Status: DC | PRN
  Administered 2017-03-09: 1 via OPHTHALMIC

## 2017-03-09 MED ORDER — PANTOPRAZOLE SODIUM 40 MG IV SOLR: 40.0000 mg | Freq: Every day | INTRAVENOUS | Status: DC

## 2017-03-09 MED ORDER — PROPOFOL 10 MG/ML IV BOLUS
INTRAVENOUS | Status: DC | PRN
  Administered 2017-03-09: 170 mg via INTRAVENOUS

## 2017-03-09 MED ORDER — HYDROMORPHONE HCL 1 MG/ML IJ SOLN
0.2500 mg | INTRAMUSCULAR | Status: DC | PRN
  Administered 2017-03-09: 0.5 mg via INTRAVENOUS

## 2017-03-09 MED ORDER — PROPOFOL 10 MG/ML IV BOLUS
INTRAVENOUS | Status: AC
  Filled 2017-03-09: qty 20

## 2017-03-09 MED ORDER — HYDROMORPHONE HCL 1 MG/ML IJ SOLN
INTRAMUSCULAR | Status: AC
  Filled 2017-03-09: qty 1

## 2017-03-09 MED ORDER — FENTANYL CITRATE (PF) 100 MCG/2ML IJ SOLN
INTRAMUSCULAR | Status: DC | PRN
  Administered 2017-03-09: 100 ug via INTRAVENOUS
  Administered 2017-03-09 (×2): 50 ug via INTRAVENOUS

## 2017-03-09 MED ORDER — METHOCARBAMOL 500 MG PO TABS
500.0000 mg | ORAL_TABLET | Freq: Four times a day (QID) | ORAL | Status: DC | PRN
  Administered 2017-03-09: 500 mg via ORAL
  Filled 2017-03-09: qty 1

## 2017-03-09 MED ORDER — 0.9 % SODIUM CHLORIDE (POUR BTL) OPTIME
TOPICAL | Status: DC | PRN
  Administered 2017-03-09: 1000 mL

## 2017-03-09 MED ORDER — ONDANSETRON HCL 4 MG PO TABS: 4.0000 mg | ORAL_TABLET | Freq: Four times a day (QID) | ORAL | Status: DC | PRN

## 2017-03-09 MED ORDER — LACTATED RINGERS IV SOLN
INTRAVENOUS | Status: DC | PRN
  Administered 2017-03-09 (×2): via INTRAVENOUS

## 2017-03-09 MED ORDER — ZOLPIDEM TARTRATE 5 MG PO TABS: 5.0000 mg | ORAL_TABLET | Freq: Every evening | ORAL | Status: DC | PRN

## 2017-03-09 MED ORDER — EPHEDRINE 5 MG/ML INJ
INTRAVENOUS | Status: AC
  Filled 2017-03-09: qty 10

## 2017-03-09 MED ORDER — BUPIVACAINE HCL (PF) 0.5 % IJ SOLN
INTRAMUSCULAR | Status: DC | PRN
  Administered 2017-03-09: 5 mL

## 2017-03-09 MED ORDER — FENTANYL CITRATE (PF) 100 MCG/2ML IJ SOLN
INTRAMUSCULAR | Status: DC | PRN
  Administered 2017-03-09: 100 ug via INTRAVENOUS

## 2017-03-09 MED ORDER — PHENOL 1.4 % MT LIQD: 1.0000 | OROMUCOSAL | Status: DC | PRN

## 2017-03-09 MED ORDER — EPHEDRINE SULFATE 50 MG/ML IJ SOLN
INTRAMUSCULAR | Status: DC | PRN
  Administered 2017-03-09 (×2): 5 mg via INTRAVENOUS

## 2017-03-09 MED ORDER — BISACODYL 10 MG RE SUPP: 10.0000 mg | Freq: Every day | RECTAL | Status: DC | PRN

## 2017-03-09 MED ORDER — FENTANYL CITRATE (PF) 250 MCG/5ML IJ SOLN
INTRAMUSCULAR | Status: AC
  Filled 2017-03-09: qty 5

## 2017-03-09 MED ORDER — ONDANSETRON HCL 4 MG/2ML IJ SOLN
INTRAMUSCULAR | Status: DC | PRN
  Administered 2017-03-09: 4 mg via INTRAVENOUS

## 2017-03-09 MED ORDER — FENTANYL CITRATE (PF) 100 MCG/2ML IJ SOLN
INTRAMUSCULAR | Status: AC
Start: 2017-03-09 — End: 2017-03-09
  Filled 2017-03-09: qty 2

## 2017-03-09 MED ORDER — MIDAZOLAM HCL 5 MG/5ML IJ SOLN
INTRAMUSCULAR | Status: DC | PRN
  Administered 2017-03-09: 2 mg via INTRAVENOUS

## 2017-03-09 MED ORDER — LIDOCAINE-EPINEPHRINE 1 %-1:100000 IJ SOLN
INTRAMUSCULAR | Status: DC | PRN
  Administered 2017-03-09: 5 mL

## 2017-03-09 MED ORDER — METHYLPREDNISOLONE ACETATE 80 MG/ML IJ SUSP
INTRAMUSCULAR | Status: DC | PRN
  Administered 2017-03-09: 80 mg

## 2017-03-09 MED ORDER — SODIUM CHLORIDE 0.9% FLUSH
3.0000 mL | Freq: Two times a day (BID) | INTRAVENOUS | Status: DC
  Administered 2017-03-09: 3 mL via INTRAVENOUS

## 2017-03-09 SURGICAL SUPPLY — 61 items
BIT DRILL NEURO 2X3.1 SFT TUCH (MISCELLANEOUS) ×1 IMPLANT
BLADE CLIPPER SURG (BLADE) IMPLANT
BUR ROUND FLUTED 5 RND (BURR) ×2 IMPLANT
BUR ROUND FLUTED 5MM RND (BURR) ×1
CANISTER SUCT 3000ML PPV (MISCELLANEOUS) ×3 IMPLANT
CARTRIDGE OIL MAESTRO DRILL (MISCELLANEOUS) ×1 IMPLANT
DECANTER SPIKE VIAL GLASS SM (MISCELLANEOUS) ×3 IMPLANT
DERMABOND ADVANCED (GAUZE/BANDAGES/DRESSINGS) ×2
DERMABOND ADVANCED .7 DNX12 (GAUZE/BANDAGES/DRESSINGS) ×1 IMPLANT
DIFFUSER DRILL AIR PNEUMATIC (MISCELLANEOUS) ×3 IMPLANT
DRAPE LAPAROTOMY 100X72X124 (DRAPES) ×3 IMPLANT
DRAPE MICROSCOPE LEICA (MISCELLANEOUS) ×3 IMPLANT
DRAPE POUCH INSTRU U-SHP 10X18 (DRAPES) ×3 IMPLANT
DRAPE SURG 17X23 STRL (DRAPES) ×3 IMPLANT
DRILL NEURO 2X3.1 SOFT TOUCH (MISCELLANEOUS) ×3
DRSG OPSITE POSTOP 3X4 (GAUZE/BANDAGES/DRESSINGS) ×3 IMPLANT
DURAPREP 26ML APPLICATOR (WOUND CARE) ×3 IMPLANT
ELECT REM PT RETURN 9FT ADLT (ELECTROSURGICAL) ×3
ELECTRODE REM PT RTRN 9FT ADLT (ELECTROSURGICAL) ×1 IMPLANT
GAUZE SPONGE 4X4 12PLY STRL (GAUZE/BANDAGES/DRESSINGS) IMPLANT
GAUZE SPONGE 4X4 16PLY XRAY LF (GAUZE/BANDAGES/DRESSINGS) IMPLANT
GLOVE BIO SURGEON STRL SZ8 (GLOVE) ×3 IMPLANT
GLOVE BIOGEL PI IND STRL 7.0 (GLOVE) ×1 IMPLANT
GLOVE BIOGEL PI IND STRL 7.5 (GLOVE) ×1 IMPLANT
GLOVE BIOGEL PI IND STRL 8 (GLOVE) ×1 IMPLANT
GLOVE BIOGEL PI IND STRL 8.5 (GLOVE) ×1 IMPLANT
GLOVE BIOGEL PI INDICATOR 7.0 (GLOVE) ×2
GLOVE BIOGEL PI INDICATOR 7.5 (GLOVE) ×2
GLOVE BIOGEL PI INDICATOR 8 (GLOVE) ×2
GLOVE BIOGEL PI INDICATOR 8.5 (GLOVE) ×2
GLOVE ECLIPSE 8.0 STRL XLNG CF (GLOVE) ×3 IMPLANT
GLOVE EXAM NITRILE LRG STRL (GLOVE) IMPLANT
GLOVE EXAM NITRILE XL STR (GLOVE) IMPLANT
GLOVE EXAM NITRILE XS STR PU (GLOVE) IMPLANT
GLOVE SS BIOGEL STRL SZ 7.5 (GLOVE) IMPLANT
GLOVE SUPERSENSE BIOGEL SZ 7.5 (GLOVE)
GLOVE SURG SS PI 7.0 STRL IVOR (GLOVE) ×9 IMPLANT
GOWN STRL REUS W/ TWL LRG LVL3 (GOWN DISPOSABLE) ×1 IMPLANT
GOWN STRL REUS W/ TWL XL LVL3 (GOWN DISPOSABLE) ×1 IMPLANT
GOWN STRL REUS W/TWL 2XL LVL3 (GOWN DISPOSABLE) ×3 IMPLANT
GOWN STRL REUS W/TWL LRG LVL3 (GOWN DISPOSABLE) ×2
GOWN STRL REUS W/TWL XL LVL3 (GOWN DISPOSABLE) ×2
KIT BASIN OR (CUSTOM PROCEDURE TRAY) ×3 IMPLANT
KIT ROOM TURNOVER OR (KITS) ×3 IMPLANT
NEEDLE HYPO 18GX1.5 BLUNT FILL (NEEDLE) IMPLANT
NEEDLE HYPO 25X1 1.5 SAFETY (NEEDLE) ×3 IMPLANT
NEEDLE SPNL 18GX3.5 QUINCKE PK (NEEDLE) ×3 IMPLANT
NS IRRIG 1000ML POUR BTL (IV SOLUTION) ×3 IMPLANT
OIL CARTRIDGE MAESTRO DRILL (MISCELLANEOUS) ×3
PACK LAMINECTOMY NEURO (CUSTOM PROCEDURE TRAY) ×3 IMPLANT
PAD ARMBOARD 7.5X6 YLW CONV (MISCELLANEOUS) ×9 IMPLANT
RUBBERBAND STERILE (MISCELLANEOUS) ×6 IMPLANT
SPONGE SURGIFOAM ABS GEL SZ50 (HEMOSTASIS) ×3 IMPLANT
SUT VIC AB 0 CT1 18XCR BRD8 (SUTURE) ×1 IMPLANT
SUT VIC AB 0 CT1 8-18 (SUTURE) ×2
SUT VIC AB 2-0 CT1 18 (SUTURE) ×3 IMPLANT
SUT VIC AB 3-0 SH 8-18 (SUTURE) ×3 IMPLANT
SYR 5ML LL (SYRINGE) ×3 IMPLANT
TOWEL GREEN STERILE (TOWEL DISPOSABLE) ×3 IMPLANT
TOWEL GREEN STERILE FF (TOWEL DISPOSABLE) ×3 IMPLANT
WATER STERILE IRR 1000ML POUR (IV SOLUTION) ×3 IMPLANT

## 2017-03-09 NOTE — Anesthesia Procedure Notes (Signed)
Procedure Name: Intubation Date/Time: 03/09/2017 10:02 AM Performed by: Edmonia CaprioAUSTON, Jacqulyn Barresi M Pre-anesthesia Checklist: Emergency Drugs available, Suction available, Timeout performed, Patient being monitored and Patient identified Patient Re-evaluated:Patient Re-evaluated prior to induction Oxygen Delivery Method: Circle system utilized Preoxygenation: Pre-oxygenation with 100% oxygen Induction Type: IV induction Ventilation: Mask ventilation without difficulty Laryngoscope Size: Miller and 2 Grade View: Grade I Tube type: Oral Tube size: 7.0 mm Number of attempts: 1 Airway Equipment and Method: Stylet Placement Confirmation: ETT inserted through vocal cords under direct vision,  positive ETCO2 and breath sounds checked- equal and bilateral Secured at: 21 cm Tube secured with: Tape Dental Injury: Teeth and Oropharynx as per pre-operative assessment

## 2017-03-09 NOTE — Evaluation (Signed)
Physical Therapy Evaluation and Discharge Patient Details Name: [name] MRN: [id number] DOB: 03-Jan-1976 Today's Date: 03/09/2017   History of Present Illness  Pt is a 41 y/o female s/p L4-5 microdiskectomy. PMH includes DM and former smoker.   Clinical Impression  Patient evaluated by Physical Therapy with no further acute PT needs identified. All education has been completed and the patient has no further questions. Reviewed back precautions, gait training and stair navigation, and pt supervision for all mobility. Reports she will have necessary assist at home. See below for any follow-up Physical Therapy or equipment needs. PT is signing off. Thank you for this referral. Please re-consult if needs change.      Follow Up Recommendations No PT follow up    Equipment Recommendations  None recommended by PT    Recommendations for Other Services       Precautions / Restrictions Precautions Precautions: Back Precaution Booklet Issued: Yes (comment) Precaution Comments: Reviewed back precaution handout with pt.  Restrictions Weight Bearing Restrictions: No      Mobility  Bed Mobility Overal bed mobility: Needs Assistance Bed Mobility: Rolling;Sidelying to Sit;Sit to Sidelying Rolling: Supervision Sidelying to sit: Supervision     Sit to sidelying: Supervision General bed mobility comments: Supervision for safety. Verbal cues for log roll technique.   Transfers Overall transfer level: Needs assistance Equipment used: None Transfers: Sit to/from Stand Sit to Stand: Supervision         General transfer comment: Supervision for safety.   Ambulation/Gait Ambulation/Gait assistance: Supervision Ambulation Distance (Feet): 400 Feet Assistive device: None Gait Pattern/deviations: Step-through pattern;Decreased stride length Gait velocity: Decreased Gait velocity interpretation: Below normal speed for age/gender General Gait Details: Slow, cautious gait, however,  overall steady. No overt LOB noted. Educated to perform walking program at home.   Stairs Stairs: Yes Stairs assistance: Supervision Stair Management: One rail Right;Step to pattern;Alternating pattern;Forwards (HHA) Number of Stairs: 4 General stair comments: Cautious stair navigation, however, overall steady. Supervision for safety. Use of alternating pattern for ascending steps and step to pattern with HHA for descending steps. Verbal cues for LE sequencing during stair navigation.   Wheelchair Mobility    Modified Rankin (Stroke Patients Only)       Balance Overall balance assessment: Needs assistance Sitting-balance support: No upper extremity supported;Feet supported Sitting balance-Leahy Scale: Good     Standing balance support: No upper extremity supported;During functional activity Standing balance-Leahy Scale: Fair                               Pertinent Vitals/Pain Pain Assessment: 0-10 Pain Score: 4  Pain Location: back  Pain Descriptors / Indicators: Aching;Operative site guarding Pain Intervention(s): Limited activity within patient's tolerance;Monitored during session;Repositioned    Home Living Family/patient expects to be discharged to:: Private residence Living Arrangements: Spouse/significant other Available Help at Discharge: Family;Available PRN/intermittently Type of Home: House Home Access: Stairs to enter Entrance Stairs-Rails: Right Entrance Stairs-Number of Steps: 4 Home Layout: Two level;Able to live on main level with bedroom/bathroom Home Equipment: None      Prior Function Level of Independence: Independent               Hand Dominance        Extremity/Trunk Assessment   Upper Extremity Assessment Upper Extremity Assessment: Overall WFL for tasks assessed    Lower Extremity Assessment Lower Extremity Assessment: LLE deficits/detail LLE Deficits / Details: numbness from calf to foot  Cervical / Trunk  Assessment Cervical / Trunk Assessment: Other exceptions Cervical / Trunk Exceptions: s/p surgery   Communication   Communication: No difficulties  Cognition Arousal/Alertness: Awake/alert Behavior During Therapy: WFL for tasks assessed/performed Overall Cognitive Status: Within Functional Limits for tasks assessed                                        General Comments General comments (skin integrity, edema, etc.): Pt's husband present throughout session. Educated about maintaining precautions during ADLs and during car transfer.     Exercises     Assessment/Plan    PT Assessment Patent does not need any further PT services  PT Problem List         PT Treatment Interventions      PT Goals (Current goals can be found in the Care Plan section)  Acute Rehab PT Goals Patient Stated Goal: to go home tody PT Goal Formulation: With patient Time For Goal Achievement: 03/09/17 Potential to Achieve Goals: Good    Frequency     Barriers to discharge        Co-evaluation               AM-PAC PT "6 Clicks" Daily Activity  Outcome Measure Difficulty turning over in bed (including adjusting bedclothes, sheets and blankets)?: None Difficulty moving from lying on back to sitting on the side of the bed? : None Difficulty sitting down on and standing up from a chair with arms (e.g., wheelchair, bedside commode, etc,.)?: None Help needed moving to and from a bed to chair (including a wheelchair)?: None Help needed walking in hospital room?: None Help needed climbing 3-5 steps with a railing? : A Little 6 Click Score: 23    End of Session Equipment Utilized During Treatment: Gait belt Activity Tolerance: Patient tolerated treatment well Patient left: in bed;with call bell/phone within reach;with family/visitor present Nurse Communication: Mobility status PT Visit Diagnosis: Other abnormalities of gait and mobility (R26.89);Pain Pain - part of body:  (back  )    Time: [id number] PT Time Calculation (min) (ACUTE ONLY): 17 min   Charges:   PT Evaluation $PT Eval Low Complexity: 1 Low     PT G Codes:        Gladys DammeBrittany Toa Mia, PT, DPT  Acute Rehabilitation Services  Pager: [phone]   Lehman PromBrittany S Breniya Goertzen 03/09/2017, 3:55 PM

## 2017-03-09 NOTE — Discharge Summary (Signed)
Physician Discharge Summary  Patient ID: [name] MRN: [id number] DOB/AGE: 42/25/1977 42 y.o.  Admit date: 03/09/2017 Discharge date: 03/09/2017  Admission Diagnoses:Disc displacement, Lumbar L 45 left with radiculopathy, stenosis, lumbago     Discharge Diagnoses: Disc displacement, Lumbar L 45 left with radiculopathy, stenosis, lumbago  S/p Left Lumbar four-five Microdiscectomy (Left)   Active Problems:   Herniated lumbar disc without myelopathy   Discharged Condition: good  Hospital Course: [name] was admitted for surgery with dx lumbar HNP and radiculopathy. Following uncomplicated microdiscectomy L4-5 on the left, she recovered nicely and transferred to [facility] for nursing care and therapies. She has mobilized well.   Consults: None  Significant Diagnostic Studies: radiology: X-Ray: intra-op  Treatments: surgery: Left Lumbar four-five Microdiscectomy (Left)    Discharge Exam: Blood pressure (!) 143/80, pulse 94, temperature 97.8 F (36.6 C), resp. rate 18, last menstrual period 02/09/2017, SpO2 95 %, unknown if currently breastfeeding. Alert, conversant. Husband present. Her strength is full BLE. She notes mild numbness/tingling left lower leg, no leg pain, and minimal incisional pain.  Incision without erythema, swelling, or drainage beneath honeycomb & Dermabond.   Disposition: 01-Home or Self Care She verbalizes understanding of d/c instructions and agrees to call office to schedule 1 month f/u appt. Rx's for Norco & Robaxin will be sent to her pharmacy from our office.     Discharge Instructions    Diet - low sodium heart healthy    Complete by:  As directed    Increase activity slowly    Complete by:  As directed      Allergies as of 03/09/2017      Reactions   No Known Allergies       Medication List    TAKE these medications   cholecalciferol 1000 units tablet Commonly known as:  VITAMIN D Take 1,000 Units by mouth daily.    escitalopram 20 MG tablet Commonly known as:  LEXAPRO Take 20 mg by mouth daily.   ibuprofen 200 MG tablet Commonly known as:  ADVIL,MOTRIN Take 800 mg by mouth every 8 (eight) hours as needed for headache or mild pain.        Signed: Dorian HeckleSTERN,Sarahjane Matherly D, MD 03/09/2017, 5:35 PM

## 2017-03-09 NOTE — Transfer of Care (Signed)
Immediate Anesthesia Transfer of Care Note  Patient: [name]  Procedure(s) Performed: Procedure(s): Left Lumbar four-five Microdiscectomy (Left)  Patient Location: PACU  Anesthesia Type:General  Level of Consciousness: awake, alert  and oriented  Airway & Oxygen Therapy: Patient Spontanous Breathing and Patient connected to nasal cannula oxygen  Post-op Assessment: Report given to RN, Post -op Vital signs reviewed and stable and Patient moving all extremities X 4  Post vital signs: Reviewed and stable  Last Vitals:  Vitals:   03/09/17 0820 03/09/17 1145  BP: 125/82   Resp: 18   Temp: 36.8 C (!) (P) 36.3 C    Last Pain:  Vitals:   03/09/17 0812  PainSc: 2          Complications: No apparent anesthesia complications

## 2017-03-09 NOTE — Progress Notes (Signed)
Patient alert and oriented, mae's well, voiding adequate amount of urine, swallowing without difficulty, no c/o pain at time of discharge. Patient discharged home with family. Script and discharged instructions given to patient. Patient and family stated understanding of instructions given. Patient has an appointment with Dr.Stern    

## 2017-03-09 NOTE — Anesthesia Preprocedure Evaluation (Addendum)
Anesthesia Evaluation  Patient identified by MRN, date of birth, ID band Patient awake    Reviewed: Allergy & Precautions, NPO status , Patient's Chart, lab work & pertinent test results  History of Anesthesia Complications (+) PONV  Airway Mallampati: I  TM Distance: >3 FB Neck ROM: Full    Dental  (+) Teeth Intact, Dental Advisory Given   Pulmonary former smoker,    breath sounds clear to auscultation       Cardiovascular negative cardio ROS   Rhythm:Regular Rate:Normal     Neuro/Psych    GI/Hepatic negative GI ROS, Neg liver ROS,   Endo/Other  diabetes  Renal/GU negative Renal ROS     Musculoskeletal   Abdominal   Peds  Hematology   Anesthesia Other Findings Patient denies use of any sort of hormonal birth control/ sugammadex use discussed.  Reproductive/Obstetrics                          Anesthesia Physical Anesthesia Plan  ASA: III  Anesthesia Plan: General   Post-op Pain Management:    Induction: Intravenous  PONV Risk Score and Plan: Treatment may vary due to age or medical condition  Airway Management Planned:   Additional Equipment:   Intra-op Plan:   Post-operative Plan: Extubation in OR  Informed Consent: I have reviewed the patients History and Physical, chart, labs and discussed the procedure including the risks, benefits and alternatives for the proposed anesthesia with the patient or authorized representative who has indicated his/her understanding and acceptance.   Dental advisory given  Plan Discussed with: Anesthesiologist and CRNA  Anesthesia Plan Comments:         Anesthesia Quick Evaluation

## 2017-03-09 NOTE — Interval H&P Note (Signed)
History and Physical Interval Note:  03/09/2017 7:58 AM  Erin BoronMegan F Appleton  has presented today for surgery, with the diagnosis of Disc displacement, Lumbar   The various methods of treatment have been discussed with the patient and family. After consideration of risks, benefits and other options for treatment, the patient has consented to  Procedure(s) with comments: Left L4-5 Microdiscectomy (Left) - Left L4-5 Microdiscectomy as a surgical intervention .  The patient's history has been reviewed, patient examined, no change in status, stable for surgery.  I have reviewed the patient's chart and labs.  Questions were answered to the patient's satisfaction.     Lois Ostrom D

## 2017-03-09 NOTE — Anesthesia Postprocedure Evaluation (Signed)
Anesthesia Post Note  Patient: Erin BoronMegan F Weber  Procedure(s) Performed: Procedure(s) (LRB): Left Lumbar four-five Microdiscectomy (Left)     Patient location during evaluation: PACU Anesthesia Type: General Level of consciousness: awake Pain management: pain level controlled Vital Signs Assessment: post-procedure vital signs reviewed and stable Respiratory status: spontaneous breathing Cardiovascular status: stable Anesthetic complications: no    Last Vitals:  Vitals:   03/09/17 1342 03/09/17 1622  BP: (!) 143/80 124/82  Pulse: 94 (!) 105  Resp: 18 18  Temp: 36.6 C 37.2 C    Last Pain:  Vitals:   03/09/17 1523  PainSc: 5                  Lydian Chavous

## 2017-03-09 NOTE — Op Note (Signed)
03/09/2017  11:31 AM  PATIENT:  [name]  41 y.o. 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 It was elected to take her to surgery for left L 45 microdiscectomy.  Procedure: Patient was brought to the operating room and following the smooth and uncomplicated induction of general endotracheal anesthesia she was placed in a prone position on the Wilson frame. Low back was prepped and draped in the usual sterile fashion with betadine scrub and DuraPrep. Preoperative localizing X ray was obtained with a spinal needle.  Area of planned incision was infiltrated with local lidocaine. Incision was made in the midline and carried to the lumbodorsal fascia which was incised on the left side of midline. Subperiosteal dissection was performed exposing what was felt to be L 45 level. Intraoperative x-ray demonstrated marker probe at L 45.  A hemi-semi-laminectomy of L 4 was performed a high-speed drill and completed with Kerrison rongeurs and a generous foraminotomy was performed overlying the superior aspect of the L 5 lamina. Ligamentum flavum was detached and removed in a piecemeal fashion and the L 5 nerve root was decompressed laterally with removal of the  superior aspect of the facet and ligamentum causing nerve root compression. The microscope was brought into the field and the L 5 nerve root was mobilized medially. This exposed a large amount of soft disc material and a infero-medially migrated free fragment of herniated disc material. Multiple fragments were removed and these extended into the interspace which appeared to be quite soft with a disrupted annulus overlying the interspace. As a result it was elected to further decompress the interspace and remove loose disc material and this was done with a variety of pituitary rongeurs. The redundant annulus was also removed with 2 mm Kerrison rongeur. A large free fragment was removed, which resulted in significant decompression of the thecal sac and left L 5 nerve root. At this point it was felt that all neural elements were well decompressed and there was no evidence of residual loose disc material within the interspace. The interspace was then irrigated with saline and no additional disc material was mobilized. Hemostasis was assured with bipolar electrocautery and the interspace was irrigated with Depo-Medrol and fentanyl. The lumbodorsal fascia was closed with 0 Vicryl sutures the subcutaneous tissues reapproximated 2-0 Vicryl inverted sutures and the skin edges were reapproximated with 3-0 Vicryl subcuticular stitch. The wound is dressed with Dermabond and an occlusive dressing. Patient was extubated in the operating room and taken to recovery in stable and satisfactory condition having tolerated her operation well counts were correct at the end of the case.   PLAN OF CARE: Admit to inpatient   PATIENT DISPOSITION:  PACU - hemodynamically stable.  Delay start of Pharmacological VTE agent (>24hrs) due to surgical blood loss or risk of bleeding: yes

## 2017-03-09 NOTE — Brief Op Note (Signed)
03/09/2017  11:31 AM  PATIENT:  [name]  41 y.o. 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 It was elected to take her to surgery for left L 45 microdiscectomy.  Procedure: Patient was brought to the operating room and following the smooth and uncomplicated induction of general endotracheal anesthesia she was placed in a prone position on the Wilson frame. Low back was prepped and draped in the usual sterile fashion with betadine scrub and DuraPrep. Preoperative localizing X ray was obtained with a spinal needle.  Area of planned incision was infiltrated with local lidocaine. Incision was made in the midline and carried to the lumbodorsal fascia which was incised on the left side of midline. Subperiosteal dissection was performed exposing what was felt to be L 45 level. Intraoperative x-ray demonstrated marker probe at L 45.  A hemi-semi-laminectomy of L 4 was performed a high-speed drill and completed with Kerrison rongeurs and a generous foraminotomy was performed overlying the superior aspect of the L 5 lamina. Ligamentum flavum was detached and removed in a piecemeal fashion and the L 5 nerve root was decompressed laterally with removal of the  superior aspect of the facet and ligamentum causing nerve root compression. The microscope was brought into the field and the L 5 nerve root was mobilized medially. This exposed a large amount of soft disc material and a infero-medially migrated free fragment of herniated disc material. Multiple fragments were removed and these extended into the interspace which appeared to be quite soft with a disrupted annulus overlying the interspace. As a result it was elected to further decompress the interspace and remove loose disc material and this was done with a variety of pituitary rongeurs. The redundant annulus was also removed with 2 mm Kerrison rongeur. A large free fragment was removed, which resulted in significant decompression of the thecal sac and left L 5 nerve root. At this point it was felt that all neural elements were well decompressed and there was no evidence of residual loose disc material within the interspace. The interspace was then irrigated with saline and no additional disc material was mobilized. Hemostasis was assured with bipolar electrocautery and the interspace was irrigated with Depo-Medrol and fentanyl. The lumbodorsal fascia was closed with 0 Vicryl sutures the subcutaneous tissues reapproximated 2-0 Vicryl inverted sutures and the skin edges were reapproximated with 3-0 Vicryl subcuticular stitch. The wound is dressed with Dermabond and an occlusive dressing. Patient was extubated in the operating room and taken to recovery in stable and satisfactory condition having tolerated her operation well counts were correct at the end of the case.   PLAN OF CARE: Admit to inpatient   PATIENT DISPOSITION:  PACU - hemodynamically stable.     Delay start of Pharmacological VTE agent (>24hrs) due to surgical blood loss or risk of bleeding: yes

## 2017-03-09 NOTE — Progress Notes (Signed)
Awake, alert, conversant.  Leg pain better.  Strength in left PF/DF/EHL 5/5.  No numbness.  Doing well.

## 2017-03-10 ENCOUNTER — Encounter (HOSPITAL_COMMUNITY): Payer: Self-pay | Admitting: Neurosurgery

## 2017-03-10 NOTE — Progress Notes (Signed)
   03/09/17 1547  PT G-Codes **NOT FOR INPATIENT CLASS**  Functional Assessment Tool Used AM-PAC 6 Clicks Basic Mobility  Functional Limitation Mobility: Walking and moving around  Mobility: Walking and Moving Around Current Status ([id number](G8978) CI  Mobility: Walking and Moving Around Goal Status [phone](G8979) CI  Mobility: Walking and Moving Around Discharge Status [phone](G8980) CI   Inserting G Codes  Gladys DammeBrittany Margart Zemanek, PT, DPT  Acute Rehabilitation Services  Pager: [phone]

## 2017-06-29 ENCOUNTER — Other Ambulatory Visit: Payer: Self-pay | Admitting: Family Medicine

## 2017-06-29 DIAGNOSIS — N6489 Other specified disorders of breast: Secondary | ICD-10-CM

## 2017-07-06 ENCOUNTER — Other Ambulatory Visit: Payer: 59

## 2017-07-08 ENCOUNTER — Ambulatory Visit
Admission: RE | Admit: 2017-07-08 | Discharge: 2017-07-08 | Disposition: A | Discharge status: Home / Self Care | Payer: 59 | Source: Ambulatory Visit | Attending: Family Medicine | Admitting: Family Medicine

## 2017-07-08 ENCOUNTER — Ambulatory Visit: Admission: RE | Admit: 2017-07-08 | Payer: 59 | Source: Ambulatory Visit

## 2017-07-08 DIAGNOSIS — N6489 Other specified disorders of breast: Secondary | ICD-10-CM

## 2017-10-04 ENCOUNTER — Other Ambulatory Visit (HOSPITAL_COMMUNITY): Payer: Self-pay | Admitting: Neurosurgery

## 2017-10-04 ENCOUNTER — Other Ambulatory Visit: Payer: Self-pay | Admitting: Neurosurgery

## 2017-10-04 DIAGNOSIS — M5416 Radiculopathy, lumbar region: Secondary | ICD-10-CM

## 2017-10-06 ENCOUNTER — Ambulatory Visit (HOSPITAL_COMMUNITY)
Admission: RE | Admit: 2017-10-06 | Discharge: 2017-10-06 | Disposition: A | Discharge status: Home / Self Care | Payer: 59 | Source: Ambulatory Visit | Attending: Neurosurgery | Admitting: Neurosurgery

## 2017-10-06 DIAGNOSIS — M4726 Other spondylosis with radiculopathy, lumbar region: Secondary | ICD-10-CM | POA: Insufficient documentation

## 2017-10-06 DIAGNOSIS — M5116 Intervertebral disc disorders with radiculopathy, lumbar region: Secondary | ICD-10-CM | POA: Insufficient documentation

## 2017-10-06 DIAGNOSIS — M5416 Radiculopathy, lumbar region: Secondary | ICD-10-CM

## 2017-10-06 MED ORDER — GADOBENATE DIMEGLUMINE 529 MG/ML IV SOLN
20.0000 mL | Freq: Once | INTRAVENOUS | Status: AC | PRN
  Administered 2017-10-06: 20 mL via INTRAVENOUS

## 2019-05-21 IMAGING — MR MR LUMBAR SPINE WO/W CM
4 of 7 series · 18 of 48 positions shown · IV contrast (multihance)
Comparison: 01/19/2017 lumbar spine MRI.

CLINICAL DATA: 41 y/o F; back pain radiating into the left leg.
History of lumbar laminectomy and decompression with microdiskectomy
L4-5.

EXAM:
MRI LUMBAR SPINE WITHOUT AND WITH CONTRAST
TECHNIQUE: Multiplanar and multiecho pulse sequences of the lumbar spine were
obtained without and with intravenous contrast.
CONTRAST:  20mL MULTIHANCE GADOBENATE DIMEGLUMINE 529 MG/ML IV SOLN

[Series 3: T1 · sagittal · 4.0mm · 0.51mm/px · 3 of 12 slices shown (1 of 2)]
[im 1/12]
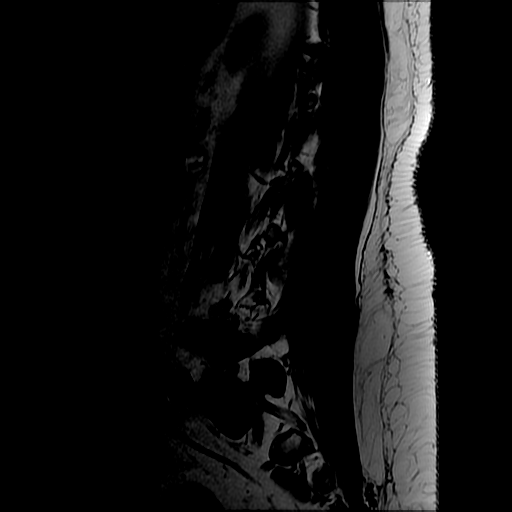
[im 6/12]
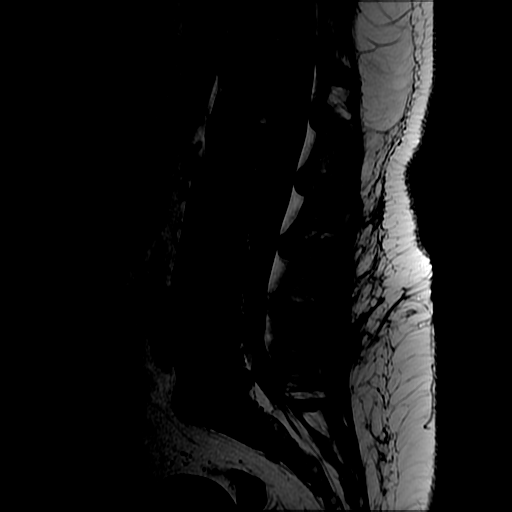
[im 12/12]
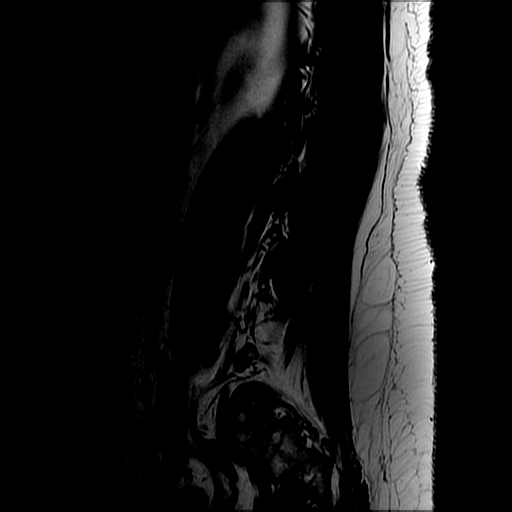

[Series 5: T2 · axial · 4.0mm · 0.39mm/px · z∈[-56,+100]mm · 9 of 32 slices shown]
[im 1/32]
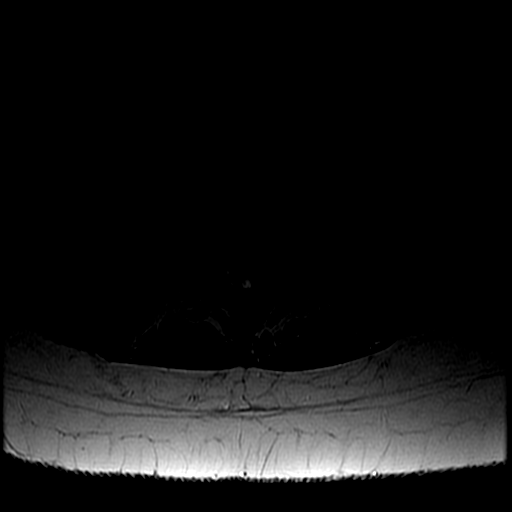
[im 4/32]
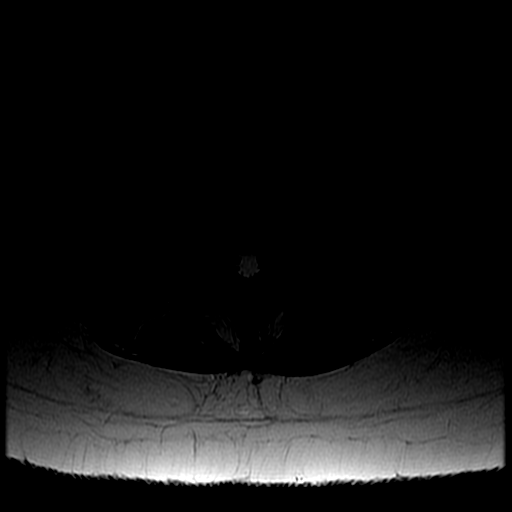
[im 7/32]
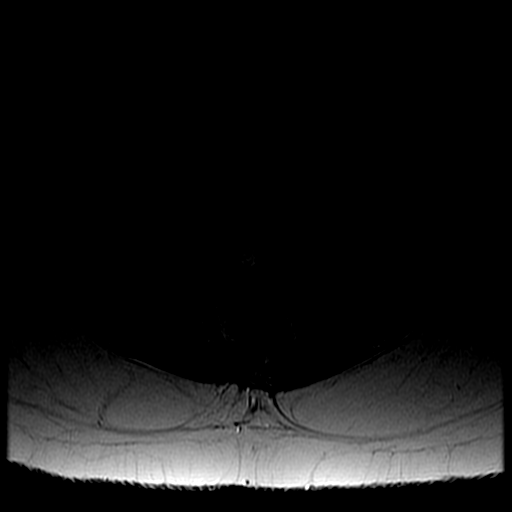
[im 10/32]
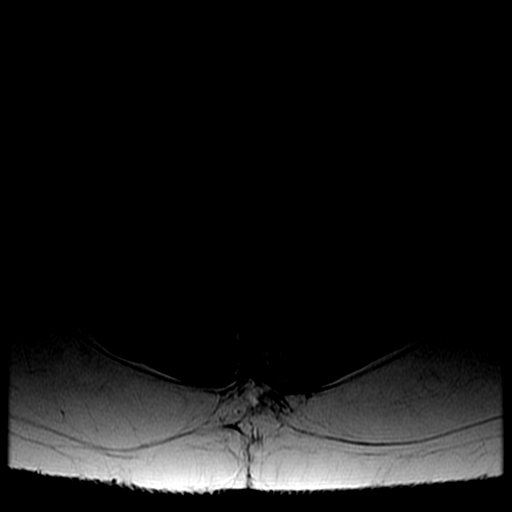
[im 13/32]
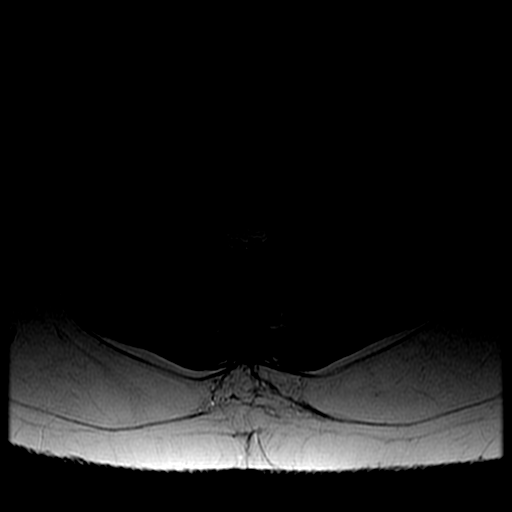
[im 16/32]
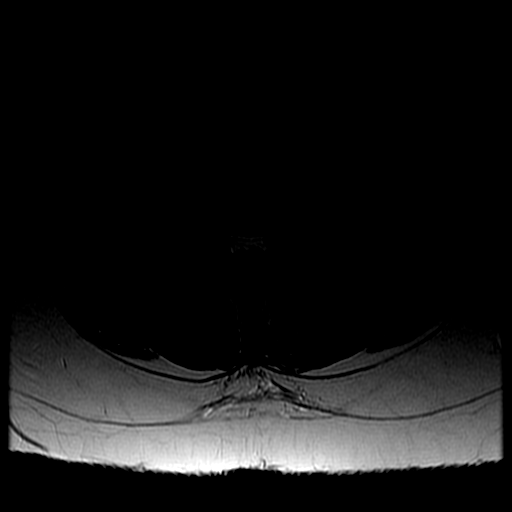
[im 19/32]
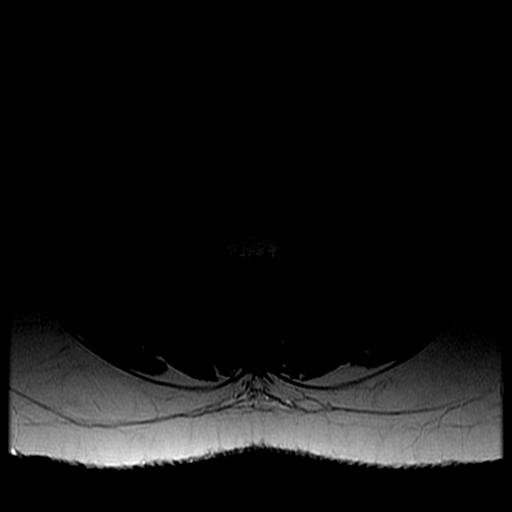
[im 22/32]
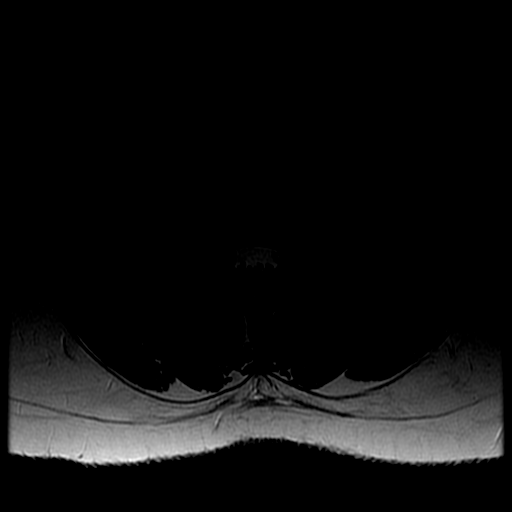
[im 28/32]
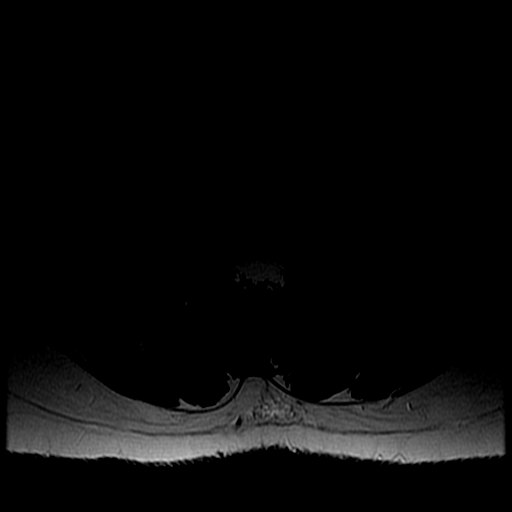

[Series 6: T1 · axial · 4.0mm · 0.39mm/px · z∈[-41,+100]mm · 3 of 32 slices shown (2 of 2)]
[im 4/32]
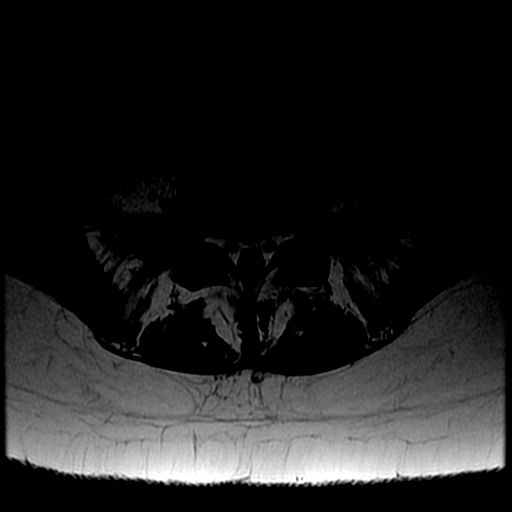
[im 16/32]
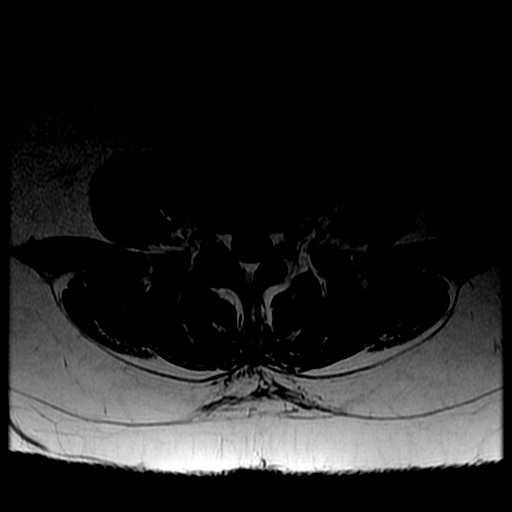
[im 28/32]
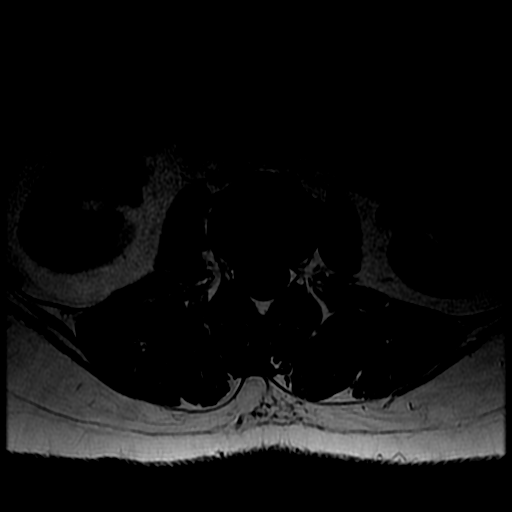

[Series 7: T2 post-contrast · sagittal · 4.0mm · 0.51mm/px · 3 of 12 slices shown]
[im 1/12]
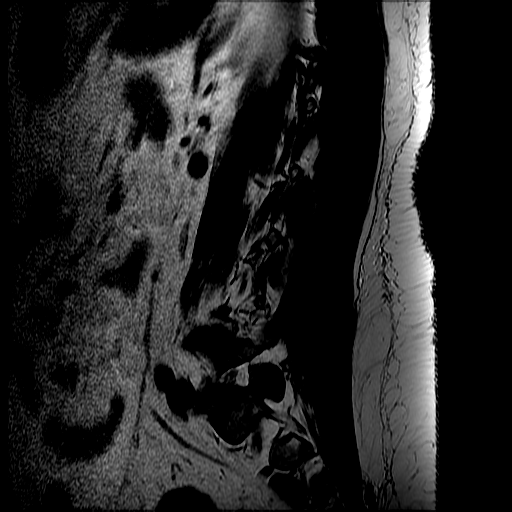
[im 8/12]
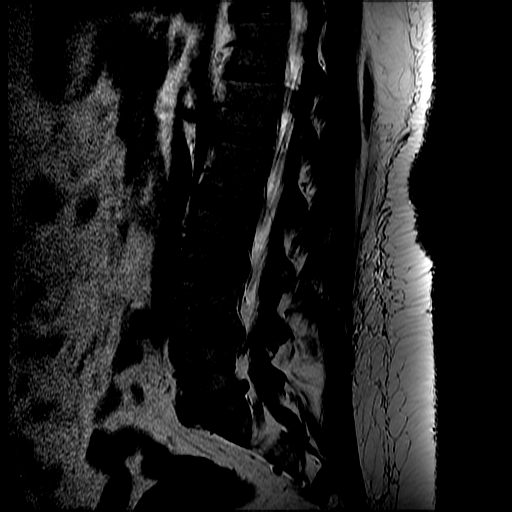
[im 12/12]
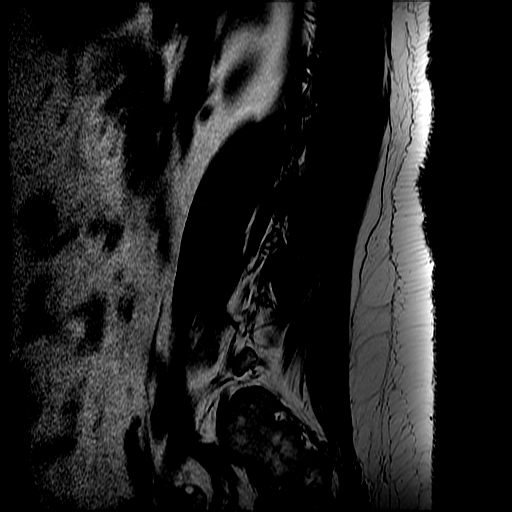

[18 of 48 positions shown; findings below may reference images not displayed]

FINDINGS: Segmentation:  Standard.

Alignment:  Physiologic.

Vertebrae: Mild edema and enhancement is present within the
endplates of left aspect of L4-5 vertebral body which may be related
to recent injury or degenerative change.

Conus medullaris and cauda equina: Conus extends to the L1-2 level.
Conus and cauda equina appear normal.

Paraspinal and other soft tissues: Postsurgical changes are present
within subcutaneous fat and paraspinal muscles at L4-5 level related
to left hemi laminectomy. No fluid collection.

Disc levels:

L1-2: No significant disc displacement, foraminal stenosis, or canal
stenosis.

L2-3: No significant disc displacement, foraminal stenosis, or canal
stenosis.

L3-4: Stable small disc bulge and central protrusion. No significant
foraminal or canal stenosis.

L4-5: 8 mm left subarticular disc protrusion originating lateral to
original protrusion with annular fissure effaces left lateral recess
with contact on the descending left L5 nerve root. There is
enhancement within the left hemi laminectomy bed, lateral epidural
space, and disc compatible with postsurgical change and
granulation/scar tissue. No fluid collection.

Broad-based disc bulge eccentric to the left is stable with mild
left foraminal stenosis. Mild canal stenosis.

L5-S1: Stable disc bulge and small central protrusion with mild
foraminal and lateral recess stenosis. No significant canal
stenosis.
IMPRESSION: L4-5 8 mm left subarticular protrusion lateral in location to
original protrusion effacing left lateral recess and contacting
descending left L5 nerve root.

Mild enhancement within left L4-5 laminectomy bed, epidural space,
and disc compatible postsurgical change and granulation tissue. No
fluid collection.

Otherwise stable lumbar degenerative changes without high-grade
foraminal or canal stenosis.

By: Urtu Myride M.D.

## 2019-11-02 ENCOUNTER — Ambulatory Visit: Payer: 59

## 2019-12-01 ENCOUNTER — Ambulatory Visit: Payer: 59 | Attending: Internal Medicine

## 2019-12-01 DIAGNOSIS — Z23 Encounter for immunization: Secondary | ICD-10-CM

## 2019-12-01 NOTE — Progress Notes (Signed)
   Covid-19 Vaccination Clinic  Name:  LILLIEMAE FRUGE    MRN: [id number] DOB: Nov 18, 1975  12/01/2019  Ms. Neubecker was observed post Covid-19 immunization for 15 minutes without incident. She was provided with Vaccine Information Sheet and instruction to access the V-Safe system.   Ms. [place] was instructed to call 911 with any severe reactions post vaccine: Marland Kitchen Difficulty breathing  . Swelling of face and throat  . A fast heartbeat  . A bad rash all over body  . Dizziness and weakness   Immunizations Administered    Name Date Dose VIS Date Route   Pfizer COVID-19 Vaccine 12/01/2019  8:48 AM 0.3 mL 09/27/2018 Intramuscular   Manufacturer: [employer], [employer]   Lot: [id number]   NDC: [address]-5

## 2019-12-25 ENCOUNTER — Ambulatory Visit: Payer: 59 | Attending: Internal Medicine

## 2019-12-25 ENCOUNTER — Ambulatory Visit: Payer: 59

## 2019-12-25 DIAGNOSIS — Z23 Encounter for immunization: Secondary | ICD-10-CM

## 2019-12-25 NOTE — Progress Notes (Signed)
   Covid-19 Vaccination Clinic  Name:  [name]    MRN: [id number] DOB: 02-Oct-1975  12/25/2019  Ms. [name] was observed post Covid-19 immunization for 15 minutes without incident. She was provided with Vaccine Information Sheet and instruction to access the V-Safe system.   Ms. [name] was instructed to call 911 with any severe reactions post vaccine: Marland Kitchen Difficulty breathing  . Swelling of face and throat  . A fast heartbeat  . A bad rash all over body  . Dizziness and weakness   Immunizations Administered    Name Date Dose VIS Date Route   Pfizer COVID-19 Vaccine 12/25/2019  8:32 AM 0.3 mL 09/27/2018 Intramuscular   Manufacturer: [employer], [employer]   Lot: [id number]   NDC: [address]-2
